# Patient Record
Sex: Female | Born: 1990 | Race: White | Hispanic: No | Marital: Single | Smoking: Never smoker
Health system: Southern US, Community
[De-identification: ages and names within clinical notes are randomized; demographics above are authoritative.]

## PROBLEM LIST (undated history)

## (undated) DIAGNOSIS — A6920 Lyme disease, unspecified: Secondary | ICD-10-CM

---

## 2012-01-09 ENCOUNTER — Ambulatory Visit (HOSPITAL_COMMUNITY)
Admission: EM | Admit: 2012-01-09 | Discharge: 2012-01-10 | Disposition: A | Discharge status: Home / Self Care | Payer: Managed Care, Other (non HMO) | Attending: General Surgery | Admitting: General Surgery

## 2012-01-09 ENCOUNTER — Other Ambulatory Visit (INDEPENDENT_AMBULATORY_CARE_PROVIDER_SITE_OTHER): Payer: Self-pay | Admitting: General Surgery

## 2012-01-09 ENCOUNTER — Encounter (HOSPITAL_COMMUNITY)
Admission: EM | Disposition: A | Discharge status: Home / Self Care | Payer: Self-pay | Source: Home / Self Care | Attending: Emergency Medicine

## 2012-01-09 ENCOUNTER — Encounter (HOSPITAL_COMMUNITY): Payer: Self-pay | Admitting: *Deleted

## 2012-01-09 ENCOUNTER — Encounter (HOSPITAL_COMMUNITY): Payer: Self-pay | Admitting: Registered Nurse

## 2012-01-09 ENCOUNTER — Emergency Department (HOSPITAL_COMMUNITY): Payer: Managed Care, Other (non HMO)

## 2012-01-09 ENCOUNTER — Emergency Department (HOSPITAL_COMMUNITY): Payer: Managed Care, Other (non HMO) | Admitting: Registered Nurse

## 2012-01-09 DIAGNOSIS — K602 Anal fissure, unspecified: Secondary | ICD-10-CM

## 2012-01-09 DIAGNOSIS — K37 Unspecified appendicitis: Secondary | ICD-10-CM

## 2012-01-09 DIAGNOSIS — K625 Hemorrhage of anus and rectum: Secondary | ICD-10-CM | POA: Insufficient documentation

## 2012-01-09 DIAGNOSIS — K921 Melena: Secondary | ICD-10-CM

## 2012-01-09 DIAGNOSIS — R109 Unspecified abdominal pain: Secondary | ICD-10-CM | POA: Insufficient documentation

## 2012-01-09 DIAGNOSIS — R110 Nausea: Secondary | ICD-10-CM | POA: Insufficient documentation

## 2012-01-09 DIAGNOSIS — K3580 Unspecified acute appendicitis: Secondary | ICD-10-CM | POA: Insufficient documentation

## 2012-01-09 DIAGNOSIS — R1031 Right lower quadrant pain: Secondary | ICD-10-CM

## 2012-01-09 DIAGNOSIS — R197 Diarrhea, unspecified: Secondary | ICD-10-CM | POA: Insufficient documentation

## 2012-01-09 DIAGNOSIS — Z87898 Personal history of other specified conditions: Secondary | ICD-10-CM | POA: Insufficient documentation

## 2012-01-09 HISTORY — DX: Lyme disease, unspecified: A69.20

## 2012-01-09 HISTORY — PX: EXAMINATION UNDER ANESTHESIA: SHX1540

## 2012-01-09 HISTORY — PX: LAPAROSCOPIC APPENDECTOMY: SHX408

## 2012-01-09 LAB — CBC
HCT: 38.1 % (ref 36.0–46.0)
Hemoglobin: 13.3 g/dL (ref 12.0–15.0)
MCHC: 34.9 g/dL (ref 30.0–36.0)
RDW: 12.2 % (ref 11.5–15.5)
WBC: 9.5 10*3/uL (ref 4.0–10.5)

## 2012-01-09 LAB — COMPREHENSIVE METABOLIC PANEL
ALT: 12 U/L (ref 0–35)
AST: 15 U/L (ref 0–37)
Albumin: 4.3 g/dL (ref 3.5–5.2)
Alkaline Phosphatase: 46 U/L (ref 39–117)
CO2: 24 mEq/L (ref 19–32)
Chloride: 103 mEq/L (ref 96–112)
Creatinine, Ser: 0.77 mg/dL (ref 0.50–1.10)
GFR calc non Af Amer: >90 mL/min (ref >90)
Potassium: 3.7 mEq/L (ref 3.5–5.1)
Total Bilirubin: 0.6 mg/dL (ref 0.3–1.2)

## 2012-01-09 LAB — URINALYSIS, ROUTINE W REFLEX MICROSCOPIC
Bilirubin Urine: NEGATIVE
Glucose, UA: NEGATIVE mg/dL
Hgb urine dipstick: NEGATIVE
Ketones, ur: NEGATIVE mg/dL
Protein, ur: NEGATIVE mg/dL
Urobilinogen, UA: 0.2 mg/dL (ref 0.0–1.0)

## 2012-01-09 LAB — OCCULT BLOOD, POC DEVICE: Fecal Occult Bld: NEGATIVE

## 2012-01-09 LAB — POCT PREGNANCY, URINE: Preg Test, Ur: NEGATIVE

## 2012-01-09 LAB — DIFFERENTIAL
Basophils Absolute: 0 10*3/uL (ref 0.0–0.1)
Basophils Relative: 0 % (ref 0–1)
Lymphocytes Relative: 11 % — ABNORMAL LOW (ref 12–46)
Monocytes Absolute: 0.5 10*3/uL (ref 0.1–1.0)
Neutro Abs: 8 10*3/uL — ABNORMAL HIGH (ref 1.7–7.7)
Neutrophils Relative %: 83 % — ABNORMAL HIGH (ref 43–77)

## 2012-01-09 SURGERY — APPENDECTOMY, LAPAROSCOPIC
Anesthesia: General | Site: Rectum | Wound class: Contaminated

## 2012-01-09 MED ORDER — PROPOFOL 10 MG/ML IV EMUL
INTRAVENOUS | Status: DC | PRN
  Administered 2012-01-09: 140 mg via INTRAVENOUS

## 2012-01-09 MED ORDER — KETOROLAC TROMETHAMINE 30 MG/ML IJ SOLN: 15.0000 mg | Freq: Once | INTRAMUSCULAR | Status: DC | PRN

## 2012-01-09 MED ORDER — ONDANSETRON HCL 4 MG/2ML IJ SOLN
4.0000 mg | Freq: Once | INTRAMUSCULAR | Status: AC
  Administered 2012-01-09: 4 mg via INTRAVENOUS
  Filled 2012-01-09: qty 2

## 2012-01-09 MED ORDER — KETOROLAC TROMETHAMINE 15 MG/ML IJ SOLN
INTRAMUSCULAR | Status: AC
  Administered 2012-01-09: 15 mg
  Filled 2012-01-09: qty 1

## 2012-01-09 MED ORDER — KCL IN DEXTROSE-NACL 20-5-0.9 MEQ/L-%-% IV SOLN
INTRAVENOUS | Status: DC
  Administered 2012-01-09: 19:00:00 via INTRAVENOUS
  Filled 2012-01-09 (×5): qty 1000

## 2012-01-09 MED ORDER — MIDAZOLAM HCL 5 MG/5ML IJ SOLN
INTRAMUSCULAR | Status: DC | PRN
  Administered 2012-01-09: 2 mg via INTRAVENOUS

## 2012-01-09 MED ORDER — SODIUM CHLORIDE 0.9 % IV SOLN: 3.0000 g | Freq: Once | INTRAVENOUS | Status: DC

## 2012-01-09 MED ORDER — LACTATED RINGERS IV SOLN
INTRAVENOUS | Status: DC | PRN
  Administered 2012-01-09: 15:00:00 via INTRAVENOUS

## 2012-01-09 MED ORDER — GLYCOPYRROLATE 0.2 MG/ML IJ SOLN
INTRAMUSCULAR | Status: DC | PRN
  Administered 2012-01-09: .3 mg via INTRAVENOUS

## 2012-01-09 MED ORDER — ONDANSETRON HCL 4 MG/2ML IJ SOLN: 4.0000 mg | Freq: Four times a day (QID) | INTRAMUSCULAR | Status: DC | PRN

## 2012-01-09 MED ORDER — ONDANSETRON HCL 4 MG/2ML IJ SOLN
INTRAMUSCULAR | Status: DC | PRN
  Administered 2012-01-09: 4 mg via INTRAVENOUS

## 2012-01-09 MED ORDER — NEOSTIGMINE METHYLSULFATE 1 MG/ML IJ SOLN
INTRAMUSCULAR | Status: DC | PRN
  Administered 2012-01-09: 2.5 mg via INTRAVENOUS

## 2012-01-09 MED ORDER — LIDOCAINE HCL (CARDIAC) 20 MG/ML IV SOLN
INTRAVENOUS | Status: DC | PRN
  Administered 2012-01-09: 80 mg via INTRAVENOUS

## 2012-01-09 MED ORDER — BUPIVACAINE-EPINEPHRINE 0.5% -1:200000 IJ SOLN
INTRAMUSCULAR | Status: DC | PRN
  Administered 2012-01-09: 11 mL

## 2012-01-09 MED ORDER — BUPIVACAINE-EPINEPHRINE (PF) 0.5% -1:200000 IJ SOLN
INTRAMUSCULAR | Status: AC
  Filled 2012-01-09: qty 10

## 2012-01-09 MED ORDER — SODIUM CHLORIDE 0.9 % IV BOLUS (SEPSIS): 1000.0000 mL | Freq: Once | INTRAVENOUS | Status: DC

## 2012-01-09 MED ORDER — DROPERIDOL 2.5 MG/ML IJ SOLN
INTRAMUSCULAR | Status: DC | PRN
  Administered 2012-01-09: 0.625 mg via INTRAVENOUS

## 2012-01-09 MED ORDER — SODIUM CHLORIDE 0.9 % IV SOLN
3.0000 g | Freq: Four times a day (QID) | INTRAVENOUS | Status: DC
  Administered 2012-01-09: 3 g via INTRAVENOUS

## 2012-01-09 MED ORDER — ROCURONIUM BROMIDE 100 MG/10ML IV SOLN
INTRAVENOUS | Status: DC | PRN
  Administered 2012-01-09: 25 mg via INTRAVENOUS

## 2012-01-09 MED ORDER — AMPICILLIN-SULBACTAM SODIUM 3 (2-1) G IJ SOLR
3.0000 g | Freq: Four times a day (QID) | INTRAMUSCULAR | Status: AC
  Administered 2012-01-09 – 2012-01-10 (×2): 3 g via INTRAVENOUS
  Filled 2012-01-09 (×3): qty 3

## 2012-01-09 MED ORDER — SODIUM CHLORIDE 0.9 % IV SOLN: Freq: Once | INTRAVENOUS | Status: DC

## 2012-01-09 MED ORDER — SUCCINYLCHOLINE CHLORIDE 20 MG/ML IJ SOLN
INTRAMUSCULAR | Status: DC | PRN
  Administered 2012-01-09: 100 mg via INTRAVENOUS

## 2012-01-09 MED ORDER — DEXAMETHASONE SODIUM PHOSPHATE 10 MG/ML IJ SOLN
INTRAMUSCULAR | Status: DC | PRN
  Administered 2012-01-09: 10 mg via INTRAVENOUS

## 2012-01-09 MED ORDER — ONDANSETRON HCL 4 MG PO TABS: 4.0000 mg | ORAL_TABLET | Freq: Four times a day (QID) | ORAL | Status: DC | PRN

## 2012-01-09 MED ORDER — FENTANYL CITRATE 0.05 MG/ML IJ SOLN: 25.0000 ug | INTRAMUSCULAR | Status: DC | PRN

## 2012-01-09 MED ORDER — FENTANYL CITRATE 0.05 MG/ML IJ SOLN
INTRAMUSCULAR | Status: DC | PRN
  Administered 2012-01-09 (×5): 50 ug via INTRAVENOUS

## 2012-01-09 MED ORDER — HYDROCODONE-ACETAMINOPHEN 5-325 MG PO TABS
1.0000 | ORAL_TABLET | ORAL | Status: DC | PRN
  Administered 2012-01-09 – 2012-01-10 (×3): 1 via ORAL
  Administered 2012-01-10: 2 via ORAL
  Administered 2012-01-10: 1 via ORAL
  Filled 2012-01-09: qty 2
  Filled 2012-01-09 (×3): qty 1
  Filled 2012-01-09: qty 2

## 2012-01-09 MED ORDER — PROMETHAZINE HCL 25 MG/ML IJ SOLN: 6.2500 mg | INTRAMUSCULAR | Status: DC | PRN

## 2012-01-09 MED ORDER — LACTATED RINGERS IV SOLN: INTRAVENOUS | Status: DC

## 2012-01-09 MED ORDER — MORPHINE SULFATE 2 MG/ML IJ SOLN: 2.0000 mg | INTRAMUSCULAR | Status: DC | PRN

## 2012-01-09 MED ORDER — IOHEXOL 300 MG/ML  SOLN
100.0000 mL | Freq: Once | INTRAMUSCULAR | Status: AC | PRN
  Administered 2012-01-09: 100 mL via INTRAVENOUS

## 2012-01-09 MED ORDER — 0.9 % SODIUM CHLORIDE (POUR BTL) OPTIME
TOPICAL | Status: DC | PRN
  Administered 2012-01-09: 1000 mL

## 2012-01-09 MED ORDER — LACTATED RINGERS IR SOLN
Status: DC | PRN
  Administered 2012-01-09: 1000 mL

## 2012-01-09 SURGICAL SUPPLY — 59 items
APPLIER CLIP 5 13 M/L LIGAMAX5 (MISCELLANEOUS)
APPLIER CLIP ROT 10 11.4 M/L (STAPLE) ×3
BENZOIN TINCTURE PRP APPL 2/3 (GAUZE/BANDAGES/DRESSINGS) ×3 IMPLANT
BLADE HEX COATED 2.75 (ELECTRODE) IMPLANT
BLADE SURG 15 STRL LF DISP TIS (BLADE) IMPLANT
BLADE SURG 15 STRL SS (BLADE)
CANISTER SUCTION 2500CC (MISCELLANEOUS) ×3 IMPLANT
CHLORAPREP W/TINT 26ML (MISCELLANEOUS) ×3 IMPLANT
CLIP APPLIE 5 13 M/L LIGAMAX5 (MISCELLANEOUS) IMPLANT
CLIP APPLIE ROT 10 11.4 M/L (STAPLE) ×2 IMPLANT
CLOTH BEACON ORANGE TIMEOUT ST (SAFETY) ×3 IMPLANT
CUTTER FLEX LINEAR 45M (STAPLE) ×3 IMPLANT
DECANTER SPIKE VIAL GLASS SM (MISCELLANEOUS) ×3 IMPLANT
DRAIN PENROSE 18X1/2 LTX STRL (DRAIN) IMPLANT
DRAPE LAPAROSCOPIC ABDOMINAL (DRAPES) ×3 IMPLANT
DRAPE TABLE BACK 44X90 PK DISP (DRAPES) IMPLANT
DRAPE UTILITY XL STRL (DRAPES) ×3 IMPLANT
DRSG PAD ABDOMINAL 8X10 ST (GAUZE/BANDAGES/DRESSINGS) IMPLANT
DRSG TEGADERM 2-3/8X2-3/4 SM (GAUZE/BANDAGES/DRESSINGS) ×9 IMPLANT
ELECT REM PT RETURN 9FT ADLT (ELECTROSURGICAL) ×3
ELECTRODE REM PT RTRN 9FT ADLT (ELECTROSURGICAL) ×2 IMPLANT
ENDOLOOP SUT PDS II  0 18 (SUTURE)
ENDOLOOP SUT PDS II 0 18 (SUTURE) IMPLANT
GAUZE SPONGE 4X4 16PLY XRAY LF (GAUZE/BANDAGES/DRESSINGS) IMPLANT
GLOVE BIOGEL PI IND STRL 7.0 (GLOVE) ×2 IMPLANT
GLOVE BIOGEL PI INDICATOR 7.0 (GLOVE) ×1
GLOVE ECLIPSE 8.0 STRL XLNG CF (GLOVE) ×3 IMPLANT
GLOVE INDICATOR 8.0 STRL GRN (GLOVE) ×6 IMPLANT
GOWN STRL NON-REIN LRG LVL3 (GOWN DISPOSABLE) ×3 IMPLANT
GOWN STRL REIN XL XLG (GOWN DISPOSABLE) ×6 IMPLANT
KIT BASIN OR (CUSTOM PROCEDURE TRAY) ×3 IMPLANT
LUBRICANT JELLY K Y 4OZ (MISCELLANEOUS) IMPLANT
NDL SAFETY ECLIPSE 18X1.5 (NEEDLE) IMPLANT
NEEDLE HYPO 18GX1.5 SHARP (NEEDLE)
NEEDLE HYPO 25X1 1.5 SAFETY (NEEDLE) IMPLANT
NS IRRIG 1000ML POUR BTL (IV SOLUTION) IMPLANT
PACK LITHOTOMY IV (CUSTOM PROCEDURE TRAY) IMPLANT
PENCIL BUTTON HOLSTER BLD 10FT (ELECTRODE) ×3 IMPLANT
POUCH SPECIMEN RETRIEVAL 10MM (ENDOMECHANICALS) ×3 IMPLANT
RELOAD 45 VASCULAR/THIN (ENDOMECHANICALS) IMPLANT
RELOAD STAPLE TA45 3.5 REG BLU (ENDOMECHANICALS) ×3 IMPLANT
SCALPEL HARMONIC ACE (MISCELLANEOUS) ×3 IMPLANT
SET IRRIG TUBING LAPAROSCOPIC (IRRIGATION / IRRIGATOR) ×3 IMPLANT
SOLUTION ANTI FOG 6CC (MISCELLANEOUS) ×3 IMPLANT
SPONGE GAUZE 4X4 12PLY (GAUZE/BANDAGES/DRESSINGS) IMPLANT
SPONGE SURGIFOAM ABS GEL 12-7 (HEMOSTASIS) IMPLANT
STRIP CLOSURE SKIN 1/2X4 (GAUZE/BANDAGES/DRESSINGS) ×3 IMPLANT
SUT CHROMIC 2 0 SH (SUTURE) IMPLANT
SUT CHROMIC 3 0 SH 27 (SUTURE) IMPLANT
SUT MNCRL AB 4-0 PS2 18 (SUTURE) ×3 IMPLANT
SYR CONTROL 10ML LL (SYRINGE) IMPLANT
TOWEL OR 17X26 10 PK STRL BLUE (TOWEL DISPOSABLE) ×3 IMPLANT
TRAY FOLEY CATH 14FRSI W/METER (CATHETERS) ×3 IMPLANT
TRAY LAP CHOLE (CUSTOM PROCEDURE TRAY) ×3 IMPLANT
TROCAR BLADELESS OPT 5 75 (ENDOMECHANICALS) ×6 IMPLANT
TROCAR XCEL BLUNT TIP 100MML (ENDOMECHANICALS) ×3 IMPLANT
TUBING INSUFFLATION 10FT LAP (TUBING) ×3 IMPLANT
UNDERPAD 30X30 INCONTINENT (UNDERPADS AND DIAPERS) ×3 IMPLANT
YANKAUER SUCT BULB TIP 10FT TU (MISCELLANEOUS) IMPLANT

## 2012-01-09 NOTE — ED Provider Notes (Signed)
History     CSN: [id number]  Arrival date & time 01/09/12  0845   First MD Initiated Contact with Patient 01/09/12 [phone]      Chief Complaint  Patient presents with  . Abdominal Pain  . Nausea  . Diarrhea    (Consider location/radiation/quality/duration/timing/severity/associated sxs/prior treatment) Patient is a 21 y.o. female presenting with abdominal pain and diarrhea. The history is provided by the patient and a relative.  Abdominal Pain The primary symptoms of the illness include abdominal pain and diarrhea. The current episode started 13 to 24 hours ago. The onset of the illness was gradual.  The pain came on suddenly. The abdominal pain has been gradually worsening since its onset. The abdominal pain is located in the RLQ and suprapubic region. The abdominal pain does not radiate. The severity of the abdominal pain is 1/10. The abdominal pain is relieved by nothing. The abdominal pain is exacerbated by vomiting.  The diarrhea began more than 1 week ago. The diarrhea is bloody. The diarrhea occurs 2 to 4 times per day.  The patient states that she believes she is currently not pregnant. The patient has not had a change in bowel habit. Symptoms associated with the illness do not include chills or constipation.  Diarrhea The primary symptoms include abdominal pain and diarrhea.  The illness does not include chills or constipation.    Past Medical History  Diagnosis Date  . Lyme disease     History reviewed. No pertinent past surgical history.  History reviewed. No pertinent family history.  History  Substance Use Topics  . Smoking status: Never Smoker   . Smokeless tobacco: Never Used  . Alcohol Use: Yes     socially    OB History    Grav Para Term Preterm Abortions TAB SAB Ect Mult Living                  Review of Systems  Constitutional: Negative for chills.  Gastrointestinal: Positive for abdominal pain and diarrhea. Negative for constipation.  All other  systems reviewed and are negative.    Allergies  Latex  Home Medications  No current outpatient prescriptions on file.  BP 107/63  Pulse 90  Temp(Src) 98.2 F (36.8 C) (Oral)  Resp 18  Wt 115 lb (52.164 kg)  SpO2 100%  LMP 12/29/2011  Physical Exam  Nursing note and vitals reviewed. Constitutional: She is oriented to person, place, and time. She appears well-developed and well-nourished.  Non-toxic appearance. No distress.  HENT:  Head: Normocephalic and atraumatic.  Eyes: Conjunctivae, EOM and lids are normal. Pupils are equal, round, and reactive to light.  Neck: Normal range of motion. Neck supple. No tracheal deviation present. No mass present.  Cardiovascular: Normal rate, regular rhythm and normal heart sounds.  Exam reveals no gallop.   No murmur heard. Pulmonary/Chest: Effort normal and breath sounds normal. No stridor. No respiratory distress. She has no decreased breath sounds. She has no wheezes. She has no rhonchi. She has no rales.  Abdominal: Soft. Normal appearance and bowel sounds are normal. She exhibits no distension. There is generalized tenderness. There is no rigidity, no rebound, no guarding and no CVA tenderness.  Genitourinary: Rectum normal.  Musculoskeletal: Normal range of motion. She exhibits no edema and no tenderness.  Neurological: She is alert and oriented to person, place, and time. She has normal strength. No cranial nerve deficit or sensory deficit. GCS eye subscore is 4. GCS verbal subscore is 5. GCS motor subscore  is 6.  Skin: Skin is warm and dry. No abrasion and no rash noted.  Psychiatric: She has a normal mood and affect. Her speech is normal and behavior is normal.    ED Course  Procedures (including critical care time)   Labs Reviewed  CBC  DIFFERENTIAL  COMPREHENSIVE METABOLIC PANEL  URINALYSIS, ROUTINE W REFLEX MICROSCOPIC  URINE CULTURE   No results found.   No diagnosis found.    MDM  Spoke with patient on the  results of her studies. Also spoke with the general surgeon on call and come see patient        Toy Baker, MD 01/09/12 1251

## 2012-01-09 NOTE — Transfer of Care (Signed)
Immediate Anesthesia Transfer of Care Note  Patient: Barbara Pacheco  Procedure(s) Performed: Procedure(s) (LRB): APPENDECTOMY LAPAROSCOPIC (N/A) EXAM UNDER ANESTHESIA (N/A)  Patient Location: PACU  Anesthesia Type: General  Level of Consciousness: awake, alert , oriented and patient cooperative  Airway & Oxygen Therapy: Patient Spontanous Breathing and Patient connected to face mask oxygen  Post-op Assessment: Report given to PACU RN, Post -op Vital signs reviewed and stable and Patient moving all extremities  Post vital signs: Reviewed and stable  Complications: No apparent anesthesia complications

## 2012-01-09 NOTE — ED Notes (Signed)
Pt from home with reports of nausea, lower abdominal pain and diarrhea that started last night. Pt also reporting bright red blood in stool for a few weeks. Pt denies recent surgery or injury to abdominal area.

## 2012-01-09 NOTE — ED Notes (Signed)
Notified Amy in CT that pt has drank contrast and ready to transport

## 2012-01-09 NOTE — Anesthesia Postprocedure Evaluation (Signed)
  Anesthesia Post-op Note  Patient: Barbara Pacheco  Procedure(s) Performed: Procedure(s) (LRB): APPENDECTOMY LAPAROSCOPIC (N/A) EXAM UNDER ANESTHESIA (N/A)  Patient Location: PACU  Anesthesia Type: General  Level of Consciousness: awake and alert   Airway and Oxygen Therapy: Patient Spontanous Breathing  Post-op Pain: mild  Post-op Assessment: Post-op Vital signs reviewed, Patient's Cardiovascular Status Stable, Respiratory Function Stable, Patent Airway and No signs of Nausea or vomiting  Post-op Vital Signs: stable  Complications: No apparent anesthesia complications

## 2012-01-09 NOTE — H&P (Addendum)
Barbara Pacheco is an 21 y.o. female.   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  Initially the pain was mild and she was able to go back to sleep. However, the pain progressively worsened and she presented to the emergency department for evaluation. She had a little nausea while drinking contrast for a CT scan. She has anorexia. No fever or chills. No dysuria or hematuria. Of note is that she's had some bright red blood per rectum after bowel movement for the last 2 or 3 weeks. She denies any straining or pain during bowel movements.  The CT scan demonstrates findings suspicious for acute appendicitis with dilation the appendix and some mild inflammatory changes present. It also suggests the possibility of a short segment intussusception of the small bowel in the left lower abdominal area.  Past Medical History  Diagnosis Date  . Lyme disease     Orthostatic hypotension  History reviewed. No pertinent past surgical history.  History reviewed. No pertinent family history. Social History:  reports that she has never smoked. She has never used smokeless tobacco. She reports that she drinks alcohol. She reports that she does not use illicit drugs.  Allergies:  Allergies  Allergen Reactions  . Latex Rash    "a little irritation"    Medications Prior to Admission  Medication Dose Route Frequency Provider Last Rate Last Dose  . 0.9 %  sodium chloride infusion   Intravenous Once Toy Baker, MD      . Ampicillin-Sulbactam (UNASYN) 3 g in sodium chloride 0.9 % 100 mL IVPB  3 g Intravenous Q6H Adolph Pollack, MD      . iohexol (OMNIPAQUE) 300 MG/ML solution 100 mL  100 mL Intravenous Once PRN Medication Radiologist, MD   100 mL at 01/09/12 1149  . ondansetron (ZOFRAN) injection 4 mg  4 mg Intravenous Once Toy Baker, MD   4 mg at 01/09/12 0958  . sodium chloride 0.9 % bolus 1,000 mL  1,000  mL Intravenous Once Toy Baker, MD       No current outpatient prescriptions on file as of 01/09/2012.    Results for orders placed during the hospital encounter of 01/09/12 (from the past 48 hour(s))  CBC     Status: Normal   Collection Time   01/09/12  9:45 AM      Component Value Range Comment   WBC 9.5  4.0 - 10.5 (K/uL)    RBC 4.46  3.87 - 5.11 (MIL/uL)    Hemoglobin 13.3  12.0 - 15.0 (g/dL)    HCT 16.1  09.6 - 04.5 (%)    MCV 85.4  78.0 - 100.0 (fL)    MCH 29.8  26.0 - 34.0 (pg)    MCHC 34.9  30.0 - 36.0 (g/dL)    RDW 40.9  81.1 - 91.4 (%)    Platelets 152  150 - 400 (K/uL)   DIFFERENTIAL     Status: Abnormal   Collection Time   01/09/12  9:45 AM      Component Value Range Comment   Neutrophils Relative 83 (*) 43 - 77 (%)    Neutro Abs 8.0 (*) 1.7 - 7.7 (K/uL)    Lymphocytes Relative 11 (*) 12 - 46 (%)    Lymphs Abs 1.0  0.7 - 4.0 (K/uL)    Monocytes Relative 6  3 - 12 (%)    Monocytes  Absolute 0.5  0.1 - 1.0 (K/uL)    Eosinophils Relative 1  0 - 5 (%)    Eosinophils Absolute 0.1  0.0 - 0.7 (K/uL)    Basophils Relative 0  0 - 1 (%)    Basophils Absolute 0.0  0.0 - 0.1 (K/uL)   COMPREHENSIVE METABOLIC PANEL     Status: Normal   Collection Time   01/09/12  9:45 AM      Component Value Range Comment   Sodium 137  135 - 145 (mEq/L)    Potassium 3.7  3.5 - 5.1 (mEq/L)    Chloride 103  96 - 112 (mEq/L)    CO2 24  19 - 32 (mEq/L)    Glucose, Bld 91  70 - 99 (mg/dL)    BUN 14  6 - 23 (mg/dL)    Creatinine, Ser 4.54  0.50 - 1.10 (mg/dL)    Calcium 9.4  8.4 - 10.5 (mg/dL)    Total Protein 7.3  6.0 - 8.3 (g/dL)    Albumin 4.3  3.5 - 5.2 (g/dL)    AST 15  0 - 37 (U/L)    ALT 12  0 - 35 (U/L)    Alkaline Phosphatase 46  39 - 117 (U/L)    Total Bilirubin 0.6  0.3 - 1.2 (mg/dL)    GFR calc non Af Amer >90  >90 (mL/min)    GFR calc Af Amer >90  >90 (mL/min)   OCCULT BLOOD, POC DEVICE     Status: Normal   Collection Time   01/09/12  9:58 AM      Component Value Range Comment     Fecal Occult Bld NEGATIVE     URINALYSIS, ROUTINE W REFLEX MICROSCOPIC     Status: Normal   Collection Time   01/09/12  9:59 AM      Component Value Range Comment   Color, Urine YELLOW  YELLOW     APPearance CLEAR  CLEAR     Specific Gravity, Urine 1.018  1.005 - 1.030     pH 6.0  5.0 - 8.0     Glucose, UA NEGATIVE  NEGATIVE (mg/dL)    Hgb urine dipstick NEGATIVE  NEGATIVE     Bilirubin Urine NEGATIVE  NEGATIVE     Ketones, ur NEGATIVE  NEGATIVE (mg/dL)    Protein, ur NEGATIVE  NEGATIVE (mg/dL)    Urobilinogen, UA 0.2  0.0 - 1.0 (mg/dL)    Nitrite NEGATIVE  NEGATIVE     Leukocytes, UA NEGATIVE  NEGATIVE  MICROSCOPIC NOT DONE ON URINES WITH NEGATIVE PROTEIN, BLOOD, LEUKOCYTES, NITRITE, OR GLUCOSE <1000 mg/dL.  POCT PREGNANCY, URINE     Status: Normal   Collection Time   01/09/12 10:22 AM      Component Value Range Comment   Preg Test, Ur NEGATIVE  NEGATIVE     Ct Abdomen Pelvis W Contrast  01/09/2012  *RADIOLOGY REPORT*  Clinical Data: Abdominal pain. Bloody diarrhea.  Right lower quadrant and suprapubic pain.  Vomiting.  CT ABDOMEN AND PELVIS WITH CONTRAST  Technique:  Multidetector CT imaging of the abdomen and pelvis was performed following the standard protocol during bolus administration of intravenous contrast.  Contrast: OMNIPAQUE IOHEXOL 300 MG/ML IJ SOLN  Comparison: None.  Findings: Images of the lung bases are unremarkable.  No focal abnormality identified within the liver, spleen, pancreas, adrenal glands, or kidneys.  The gallbladder is present.  Within the left mid abdomen, there is a short segment intussusception, best seen on coronal  image number 29 and axial image number 41.  The appendix is thick-walled and contains fluid. Contrast is identified within the colon but no contrast fills the appendix.  Appendix measures 9 mm in diameter and there is a small amount of periappendiceal stranding.  The lung bases are clear.  No focal abnormality identified within the liver,  spleen, pancreas, adrenal glands, or kidneys.  The gallbladder is present.  There are scattered mesenteric lymph nodes, predominately within the right lower quadrant.  No evidence for aortic aneurysm.  The uterus is present.  No adnexal mass identified.  There is a small amount of free pelvic fluid in the right cul-de-sac.  Visualized osseous structures have a normal appearance.  IMPRESSION:  1.  Left mid abdominal short segment small bowel intussusception. A lead point should be considered. 2.  The appendix appears thickened and inflamed.  Findings are suspicious for acute appendicitis.  The findings were discussed with Dr. Freida Busman on 01/09/2012 at 12:10 p.m.  Original Report Authenticated By: Patterson Hammersmith, M.D.    Review of Systems  Constitutional: Negative for fever and chills.  HENT: Negative for congestion and sore throat.   Respiratory: Negative for cough.   Cardiovascular: Negative for chest pain.  Gastrointestinal: Positive for nausea, abdominal pain and blood in stool. Negative for diarrhea and constipation.  Genitourinary: Negative for dysuria and hematuria.  Neurological: Negative for seizures.  Endo/Heme/Allergies:       No DVTs.    Blood pressure 102/57, pulse 87, temperature 98.1 F (36.7 C), temperature source Oral, resp. rate 20, weight 115 lb (52.164 kg), last menstrual period 12/29/2011, SpO2 100.00%. Physical Exam  Constitutional: She appears well-developed and well-nourished. No distress.  HENT:  Head: Normocephalic and atraumatic.  Eyes: EOM are normal. No scleral icterus.       Wearing glasses.  Neck: Neck supple.  Cardiovascular: Normal rate and regular rhythm.   Respiratory: Effort normal and breath sounds normal.  GI: Soft. She exhibits no distension and no mass. There is tenderness (rlq area). There is guarding (voluntary).  Musculoskeletal: Normal range of motion. She exhibits no edema.  Lymphadenopathy:    She has no cervical adenopathy.  Neurological:  She is alert.  Skin: Skin is warm and dry.     Assessment/Plan 1. Acute appendicitis-does not appear perforated.  2. Rectal bleeding with bowel movements-could be secondary to hemorrhoids or a small fissure.  Plan: 1. Laparoscopic possible open appendectomy. 2. Rectal exam under anesthesia.  The procedures, risks, and after care were discussed with her. Risks include but are not limited to bleeding, infection, wound healing problems, anesthesia, injury to intra-abdominal organs. She seems to understand all this and agrees with the plan. We'll give her 3 g of IV Unasyn.  Keilah Lemire J 01/09/2012, 2:37 PM

## 2012-01-09 NOTE — Preoperative (Signed)
Beta Blockers   Reason not to administer Beta Blockers:Not Applicable 

## 2012-01-09 NOTE — Anesthesia Preprocedure Evaluation (Addendum)
Anesthesia Evaluation  Patient identified by MRN, date of birth, ID band Patient awake    Reviewed: Allergy & Precautions, H&P , NPO status , Patient's Chart, lab work & pertinent test results  Airway Mallampati: II TM Distance: >3 FB Neck ROM: Full    Dental No notable dental hx.    Pulmonary neg pulmonary ROS,  breath sounds clear to auscultation  Pulmonary exam normal       Cardiovascular negative cardio ROS  Rhythm:Regular Rate:Normal     Neuro/Psych negative neurological ROS  negative psych ROS   GI/Hepatic negative GI ROS, Neg liver ROS,   Endo/Other  negative endocrine ROS  Renal/GU negative Renal ROS  negative genitourinary   Musculoskeletal negative musculoskeletal ROS (+)   Abdominal   Peds negative pediatric ROS (+)  Hematology negative hematology ROS (+)   Anesthesia Other Findings   Reproductive/Obstetrics negative OB ROS                           Anesthesia Physical Anesthesia Plan  ASA: I and Emergent  Anesthesia Plan: General   Post-op Pain Management:    Induction: Intravenous and Rapid sequence  Airway Management Planned:   Additional Equipment:   Intra-op Plan:   Post-operative Plan:   Informed Consent: I have reviewed the patients History and Physical, chart, labs and discussed the procedure including the risks, benefits and alternatives for the proposed anesthesia with the patient or authorized representative who has indicated his/her understanding and acceptance.   Dental advisory given  Plan Discussed with: CRNA  Anesthesia Plan Comments:         Anesthesia Quick Evaluation

## 2012-01-09 NOTE — Op Note (Signed)
Operative Note  Barbara Pacheco female 21 y.o. 01/09/2012  PREOPERATIVE DX:  1. Acute appendicitis. 2. Rectal bleeding  POSTOPERATIVE DX:  1. Acute appendicitis. 2. Posterior anal fissure  PROCEDURE:  1. Laparoscopic appendectomy. 2. Rectal examination under anesthesia         Surgeon: Adolph Pollack   Assistants: None  Anesthesia: General endotracheal anesthesia  Indications: This is a 21 year old female who awoke this morning with lower abdominal pain that progressively worsened and was associated with some nausea and anorexia. She also states for 2 or 3 weeks she's had some rectal bleeding after bowel movements it is bright red in nature with no pain. CT scan has demonstrated findings consistent with acute appendicitis In a question of small bowel intussusception in the left mid to lower abdominal area.. She is brought to the operating room for the above procedures.    Procedure Detail:  She was brought to the operating room placed supine on the operating table and a general anesthetic was administered. A Foley catheter was inserted. The abdominal wall was widely sterilely prepped and draped. Marcaine was infiltrated in the subumbilical region. A small subumbilical incision was made through the skin, subcutaneous tissue, fascia, and peritoneum entering the peritoneal cavity under direct vision.  A Hassan trocar was introduced into the peritoneal cavity and a pneumoperitoneum was created by insufflation of CO2 gas. Visualization of the area under the trocar demonstrated no evidence of organ injury or bleeding.  A 5 mm trocar is placed in the left lower quadrant. The cecum was grasped and an edematous inflamed appendix was identified in the right lower quadrant. There is no evidence of abscess or perforation. A right upper quadrant 5 mm trocar was then inserted. The appendix was grasped and retracted anteriorly. The mesial appendix was divided down to the base the appendix with the  harmonic scalpel. The appendix was amputated off the cecum, with a small cuff of cecum, using the Endo GIA stapler. The appendix was placed in a retrieval bag and removed through the subumbilical incision. The subumbilical trocar was replaced.  Copious irrigation was performed of the right lower quadrant area. There is a small amount of bleeding from the staple line on the cecum this was controlled with a Hemoclip. The staple line was otherwise solid.    I then inspected the left abdomen were a question of intussusception was noted on CT scan. The small intestine was examined and no evidence of this was found.  Irrigation fluid was evacuated as much as possible. No further bleeding from the staple line was noted. The subumbilical trocar was removed and the fascial defect closed with a 0 Vicryl pursestring suture. The remaining trochars were removed and the pneumoperitoneum was released.  Skin incisions were closed with 4-0 Monocryl subcuticular stitches followed by Steri-Strips and sterile dressings.  She was then placed in the lithotomy position. Rectal examination under anesthesia was performed and demonstrated a posterior anal fissure with no evidence of bleeding. No anal masses were noted.  She tolerated both procedures without any apparent complications and was taken to the recovery room in satisfactory condition.   Estimated Blood Loss:  Minimal         Drains: none         Blood Given: none          Specimens: Appendix        Complications:  * No complications entered in OR log *         Disposition: PACU -  hemodynamically stable.         Condition: stable

## 2012-01-10 LAB — URINE CULTURE
Colony Count: >=100000
Culture  Setup Time: 201303031440

## 2012-01-10 MED ORDER — POLYETHYLENE GLYCOL 3350 17 G PO PACK
17.0000 g | PACK | Freq: Every day | ORAL | Status: DC
  Administered 2012-01-10: 17 g via ORAL
  Filled 2012-01-10: qty 1

## 2012-01-10 MED ORDER — DILTIAZEM GEL 2 %: 1.0000 "application " | Freq: Four times a day (QID) | CUTANEOUS | Status: DC

## 2012-01-10 MED ORDER — ACETAMINOPHEN 325 MG PO TABS: 650.0000 mg | ORAL_TABLET | Freq: Four times a day (QID) | ORAL | Status: DC | PRN

## 2012-01-10 MED ORDER — POLYETHYLENE GLYCOL 3350 17 G PO PACK: 17.0000 g | PACK | Freq: Every day | ORAL | Status: DC

## 2012-01-10 MED ORDER — POLYETHYLENE GLYCOL 3350 17 G PO PACK
17.0000 g | PACK | Freq: Two times a day (BID) | ORAL | Status: DC
  Filled 2012-01-10 (×3): qty 1

## 2012-01-10 MED ORDER — HYDROCODONE-ACETAMINOPHEN 5-325 MG PO TABS: 1.0000 | ORAL_TABLET | ORAL | Status: AC | PRN

## 2012-01-10 MED ORDER — POLYETHYLENE GLYCOL 3350 17 G PO PACK: 17.0000 g | PACK | Freq: Two times a day (BID) | ORAL | Status: AC

## 2012-01-10 MED ORDER — DILTIAZEM GEL 2 %: Freq: Four times a day (QID) | CUTANEOUS | Status: DC

## 2012-01-10 NOTE — Progress Notes (Signed)
Nutrition Brief Note  - Pt screened for unintentional weight loss of 20 pounds in the past year, however pt states this was not nutrition related but resulted from having Lyme disease. Pt reports she is currently eating well after appendectomy yesterday. Pt denies any nausea or diarrhea, only c/o some bloating after eating. Noted possible plans for d/c home later today. No nutrition diagnosis at this time.   Dietitian # [phone]

## 2012-01-10 NOTE — Discharge Summary (Signed)
Physician Discharge Summary  Patient ID: Barbara Pacheco MRN: [id number] DOB/AGE: 1990/12/22 20 y.o.  Admit date: 01/09/2012 Discharge date: 01/10/2012  Admission Diagnoses: Acute appendicitis-does not appear perforated. Rectal bleeding with bowel movements-could be secondary to hemorrhoids or a small fissure.   Lyme disease    Orthostatic hypotension         Discharge Diagnoses: 1. Acute appendicitis. 2. Posterior anal fissure    PROCEDURES:  Laparoscopic appendectomy. 2. Rectal examination under anesthesia 01/09/12  Dr. 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 Initially the pain was mild and she was able to go back to sleep. However, the pain progressively worsened and she presented to the emergency department for evaluation. She had a little nausea while drinking contrast for a CT scan. She has anorexia. No fever or chills. No dysuria or hematuria. Of note is that she's had some bright red blood per rectum after bowel movement for the last 2 or 3 weeks. She denies any straining or pain during bowel movements. The CT scan demonstrates findings suspicious for acute appendicitis with dilation the appendix and some mild inflammatory changes present. It also suggests the possibility of a short segment intussusception of the small bowel in the left lower abdominal area. Pt taken to OR with above noted procedure.  No intussusception noted on exam in OR. PT is doing well POD 1. Diet is being advanced. She was ready to go late PM 01/10/12.   Disposition: Home  Medication List  As of 01/10/2012  5:09 PM   TAKE these medications         acetaminophen 325 MG tablet   Commonly known as: TYLENOL   Take 2 tablets (650 mg total) by mouth every 6 (six) hours as needed.      diltiazem 2 % Gel   Apply 1 application topically 4 (four) times daily.      doxycycline 100 MG tablet   Commonly known as: VIBRA-TABS   Take 100  mg by mouth 2 (two) times daily. For 21 days      HYDROcodone-acetaminophen 5-325 MG per tablet   Commonly known as: NORCO   Take 1-2 tablets by mouth every 4 (four) hours as needed.      LOESTRIN 1/20 (21) PO   Take 1 tablet by mouth daily.      polyethylene glycol packet   Commonly known as: MIRALAX / GLYCOLAX   Take 17 g by mouth 2 (two) times daily.           Follow-up Information    Follow up with ROSENBOWER,TODD J, MD. Schedule an appointment as soon as possible for a visit in 2 weeks.   Contact information:   [employer] [address] [place] [address] [phone]       Follow up with No lifting over 20 pounds for 4 weeks.  Return to work in 3 weeks..         SignedSherrie George 01/10/2012, 5:09 PM

## 2012-01-10 NOTE — Discharge Instructions (Signed)
Laparoscopic Appendectomy Appendectomy is surgery to remove the appendix. Laparoscopic surgery uses several small cuts (incisions) instead of one large incision. Laparoscopic surgery offers a shorter recovery time and less discomfort. LET YOUR CAREGIVER KNOW ABOUT:  Allergies to food or medicine.   Medicines taken, including vitamins, dietary supplements, herbs, eyedrops, over-the-counter medicines, and creams.   Use of steroids (by mouth or creams).   Previous problems with anesthetics or numbing medicines.   History of bleeding problems or blood clots.   Previous surgery.   Other health problems, including diabetes, heart problems, lung problems, and kidney problems.   Possibility of pregnancy, if this applies.  RISKS AND COMPLICATIONS  Infection. A germ starts growing in the wound. This can usually be treated with antibiotics. In some cases, the wound will need to be opened and cleaned.   Bleeding.   Damage to other organs.   Sores (abscesses).   Chronic pain at the incision sites. This is defined as pain that lasts for more than 3 months.   Blood clots in the legs that may rarely travel to the lungs.   Infection in the lungs (pneumonia).  BEFORE THE PROCEDURE Appendectomy is usually performed immediately after an inflamed appendix (appendicitis) is diagnosed. No preparation is necessary ahead of this procedure. PROCEDURE  You will be given medicine that makes you sleep (general anesthetic). After you are asleep, a flexible tube (catheter) may be inserted into your bladder to drain your urine during surgery. The tube is removed before you wake up after surgery. When you are asleep, carbondioxide gas will be used to inflate your abdomen. This will allow your surgeon to see inside your abdomen and perform your surgery. Three small incisions will be made in your abdomen. Your surgeon will insert a thin, lighted tube (laparoscope) through one of the incisions. Your surgeon will  look through the laparoscope while performing the surgery. Other tools will be inserted through the other incisions. Laparoscopic procedures may not be appropriate when:  There is major scarring from a previous surgery.   The patient has bleeding disorders.   A pregnancy is near term.   There are other conditions which make the laparoscopic procedure impossible, such as an advanced infection or a ruptured appendix.  If your surgeon feels it is not safe to continue with the laparoscopic procedure, he or she will perform an open surgery instead. This gives the surgeon a larger view and more space to work. Open surgery requires a longer recovery time. After your appendix is removed, your incisions will be closed with stitches (sutures) or skin adhesive. AFTER THE PROCEDURE You will be taken to a recovery room. When the anesthesia has worn off, you will be returned to your hospital room. You will be given pain medicines to keep you comfortable. Ask your caregiver how long your hospital stay will be. Document Released: 06/08/2004 Document Revised: 10/14/2011 Document Reviewed: 05/04/2011 [employer] Patient Information 2012 [employer]CCS ______[facility] LAPAROSCOPIC SURGERY: POST OP INSTRUCTIONS Always review your discharge instruction sheet given to you by the facility where your surgery was performed. IF YOU HAVE DISABILITY OR FAMILY LEAVE FORMS, YOU MUST BRING THEM TO THE OFFICE FOR PROCESSING.   DO NOT GIVE THEM TO YOUR DOCTOR.  1. A prescription for pain medication may be given to you upon discharge.  Take your pain medication as prescribed, if needed.  If narcotic pain medicine is not needed, then you may take acetaminophen (Tylenol) or ibuprofen (Advil) as needed. 2. Take your   usually prescribed medications unless otherwise directed. 3. If you need a refill on your pain medication, please contact your pharmacy.  They will contact our office to request authorization.  Prescriptions will not be filled after 5pm or on week-ends. 4. You should follow a light diet the first few days after arrival home, such as soup and crackers, etc.  Be sure to include lots of fluids daily. 5. Most patients will experience some swelling and bruising in the area of the incisions.  Ice packs will help.  Swelling and bruising can take several days to resolve.  6. It is common to experience some constipation if taking pain medication after surgery.  Increasing fluid intake and taking a stool softener (such as Colace) will usually help or prevent this problem from occurring.  A mild laxative (Milk of Magnesia or Miralax) should be taken according to package instructions if there are no bowel movements after 48 hours. 7. Unless discharge instructions indicate otherwise, you may remove your bandages 24-48 hours after surgery, and you may shower at that time.  You may have steri-strips (small skin tapes) in place directly over the incision.  These strips should be left on the skin for 7-10 days.  If your surgeon used skin glue on the incision, you may shower in 24 hours.  The glue will flake off over the next 2-3 weeks.  Any sutures or staples will be removed at the office during your follow-up visit. 8. ACTIVITIES:  You may resume regular (light) daily activities beginning the next day--such as daily self-care, walking, climbing stairs--gradually increasing activities as tolerated.  You may have sexual intercourse when it is comfortable.  Refrain from any heavy lifting or straining until approved by your doctor. a. You may drive when you are no longer taking prescription pain medication, you can comfortably wear a seatbelt, and you can safely maneuver your car and apply brakes. b. RETURN TO WORK:  __________________________________________________________ 9. You should see your doctor in the office for a follow-up appointment approximately 2-3 weeks after your surgery.  Make sure that you call for  this appointment within a day or two after you arrive home to insure a convenient appointment time. 10. OTHER INSTRUCTIONS: __________________________________________________________________________________________________________________________ __________________________________________________________________________________________________________________________ WHEN TO CALL YOUR DOCTOR: 1. Fever over 101.0 2. Inability to urinate 3. Continued bleeding from incision. 4. Increased pain, redness, or drainage from the incision. 5. Increasing abdominal pain  The clinic staff is available to answer your questions during regular business hours.  Please don't hesitate to call and ask to speak to one of the nurses for clinical concerns.  If you have a medical emergency, go to the nearest emergency room or call 911.  A surgeon from [facility] is always on call at the hospital. [address], [address], [place], [place]  [address] ? [address] [address], [place], [place]   [address] [phone] ? [phone] ? FAX [phone] Web site: [email or web address] Anal Fissure, Adult An anal fissure is a small tear or crack in the skin around the anus. Bleeding from a fissure usually stops on its own within a few minutes. However, bleeding will often reoccur with each bowel movement until the crack heals.  CAUSES   Passing large, hard stools.   Frequent diarrheal stools.   Constipation.   Inflammatory bowel disease (Crohn's disease or ulcerative colitis).   Infections.   Anal sex.  SYMPTOMS   Small amounts of blood seen on your stools, on toilet paper, or in the toilet  after a bowel movement.   Rectal bleeding.   Painful bowel movements.   Itching or irritation around the anus.  DIAGNOSIS Your caregiver will examine the anal area. An anal fissure can usually be seen with careful inspection. A rectal exam may be performed and a short tube (anoscope) may be used to  examine the anal canal. TREATMENT   You may be instructed to take fiber supplements. These supplements can soften your stool to help make bowel movements easier.   Sitz baths may be recommended to help heal the tear. Do not use soap in the sitz baths.   A medicated cream or ointment may be prescribed to lessen discomfort.  HOME CARE INSTRUCTIONS   Maintain a diet high in fruits, whole grains, and vegetables. Avoid constipating foods like bananas and dairy products.   Take sitz baths as directed by your caregiver.   Drink enough fluids to keep your urine clear or pale yellow.   Only take over-the-counter or prescription medicines for pain, discomfort, or fever as directed by your caregiver. Do not take aspirin as this may increase bleeding.   Do not use ointments containing numbing medications (anesthetics) or hydrocortisone. They could slow healing.  SEEK MEDICAL CARE IF:   Your fissure is not completely healed within 3 days.   You have further bleeding.   You have a fever.   You have diarrhea mixed with blood.   You have pain.   Your problem is getting worse rather than better.  MAKE SURE YOU:   Understand these instructions.   Will watch your condition.   Will get help right away if you are not doing well or get worse.  Document Released: 10/25/2005 Document Revised: 10/14/2011 Document Reviewed: 04/11/2011 [facility] Patient Information 2012 [place], [place].

## 2012-01-10 NOTE — Progress Notes (Signed)
1 Day Post-Op  Subjective: Afebrile, VSS: BP 96/59 lying down, No labs,  Objective: Vital signs in last 24 hours: Temp:  [97.8 F (36.6 C)-98.4 F (36.9 C)] 97.8 F (36.6 C) (03/04 0603) Pulse Rate:  [66-87] 66  (03/04 0603) Resp:  [14-20] 14  (03/04 0603) BP: (96-115)/(56-73) 96/59 mmHg (03/04 0603) SpO2:  [97 %-100 %] 98 % (03/04 0603) Weight:  [52.164 kg (115 lb)] 52.164 kg (115 lb) (03/03 1848)    Intake/Output from previous day: 03/03 0701 - 03/04 0700 In: 3176.7 [P.O.:360; I.V.:2716.7; IV Piggyback:100] Out: 2860 [Urine:2850; Blood:10] Intake/Output this shift: Total I/O In: 240 [P.O.:240] Out: 300 [Urine:300]  PE:  Alert, just took two Vicodin  So not much pain.  Abd:  Soft, tender, few BS. Took a regular breakfast.    Lab Results:   Basename 01/09/12 0945  WBC 9.5  HGB 13.3  HCT 38.1  PLT 152    BMET  Basename 01/09/12 0945  NA 137  K 3.7  CL 103  CO2 24  GLUCOSE 91  BUN 14  CREATININE 0.77  CALCIUM 9.4   PT/INR No results found for this basename: LABPROT:2,INR:2 in the last 72 hours   Studies/Results: Ct Abdomen Pelvis W Contrast  01/09/2012  *RADIOLOGY REPORT*  Clinical Data: Abdominal pain. Bloody diarrhea.  Right lower quadrant and suprapubic pain.  Vomiting.  CT ABDOMEN AND PELVIS WITH CONTRAST  Technique:  Multidetector CT imaging of the abdomen and pelvis was performed following the standard protocol during bolus administration of intravenous contrast.  Contrast: OMNIPAQUE IOHEXOL 300 MG/ML IJ SOLN  Comparison: None.  Findings: Images of the lung bases are unremarkable.  No focal abnormality identified within the liver, spleen, pancreas, adrenal glands, or kidneys.  The gallbladder is present.  Within the left mid abdomen, there is a short segment intussusception, best seen on coronal image number 29 and axial image number 41.  The appendix is thick-walled and contains fluid. Contrast is identified within the colon but no contrast fills the  appendix.  Appendix measures 9 mm in diameter and there is a small amount of periappendiceal stranding.  The lung bases are clear.  No focal abnormality identified within the liver, spleen, pancreas, adrenal glands, or kidneys.  The gallbladder is present.  There are scattered mesenteric lymph nodes, predominately within the right lower quadrant.  No evidence for aortic aneurysm.  The uterus is present.  No adnexal mass identified.  There is a small amount of free pelvic fluid in the right cul-de-sac.  Visualized osseous structures have a normal appearance.  IMPRESSION:  1.  Left mid abdominal short segment small bowel intussusception. A lead point should be considered. 2.  The appendix appears thickened and inflamed.  Findings are suspicious for acute appendicitis.  The findings were discussed with Dr. Freida Busman on 01/09/2012 at 12:10 p.m.  Original Report Authenticated By: Patterson Hammersmith, M.D.    Anti-infectives: Anti-infectives     Start     Dose/Rate Route Frequency Ordered Stop   01/09/12 2200   Ampicillin-Sulbactam (UNASYN) 3 g in sodium chloride 0.9 % 100 mL IVPB  Status:  Discontinued        3 g 100 mL/hr over 60 Minutes Intravenous Every 6 hours 01/09/12 1436 01/09/12 1721   01/09/12 2200  Ampicillin-Sulbactam (UNASYN) 3 g in sodium chloride 0.9 % 100 mL IVPB       3 g 100 mL/hr over 60 Minutes Intravenous Every 6 hours 01/09/12 1721 01/10/12 0521   01/09/12  1445   Ampicillin-Sulbactam (UNASYN) 3 g in sodium chloride 0.9 % 100 mL IVPB  Status:  Discontinued        3 g 100 mL/hr over 60 Minutes Intravenous  Once 01/09/12 1438 01/09/12 1752         Current Facility-Administered Medications  Medication Dose Route Frequency Provider Last Rate Last Dose  . Ampicillin-Sulbactam (UNASYN) 3 g in sodium chloride 0.9 % 100 mL IVPB  3 g Intravenous Q6H Adolph Pollack, MD   3 g at 01/10/12 0421  . dextrose 5 % and 0.9 % NaCl with KCl 20 mEq/L infusion   Intravenous Continuous Adolph Pollack, MD 100 mL/hr at 01/09/12 1900    . HYDROcodone-acetaminophen (NORCO) 5-325 MG per tablet 1-2 tablet  1-2 tablet Oral Q4H PRN Adolph Pollack, MD   2 tablet at 01/10/12 0910  . iohexol (OMNIPAQUE) 300 MG/ML solution 100 mL  100 mL Intravenous Once PRN Medication Radiologist, MD   100 mL at 01/09/12 1149  . ketorolac (TORADOL) 15 MG/ML injection        15 mg at 01/09/12 1714  . morphine 2 MG/ML injection 2-4 mg  2-4 mg Intravenous Q2H PRN Adolph Pollack, MD      . ondansetron [facility]) tablet 4 mg  4 mg Oral Q6H PRN Adolph Pollack, MD       Or  . ondansetron [facility]) injection 4 mg  4 mg Intravenous Q6H PRN Adolph Pollack, MD      . DISCONTD: 0.9 %  sodium chloride infusion   Intravenous Once Toy Baker, MD      . DISCONTD: 0.9 % irrigation (POUR BTL)    PRN Adolph Pollack, MD   1,000 mL at 01/09/12 1613  . DISCONTD: Ampicillin-Sulbactam (UNASYN) 3 g in sodium chloride 0.9 % 100 mL IVPB  3 g Intravenous Q6H Adolph Pollack, MD   3 g at 01/09/12 1551  . DISCONTD: Ampicillin-Sulbactam (UNASYN) 3 g in sodium chloride 0.9 % 100 mL IVPB  3 g Intravenous Once Adolph Pollack, MD      . DISCONTD: bupivacaine-EPINEPHrine (MARCAINE W/ EPI) 0.5 % (with pres) injection    PRN Adolph Pollack, MD   11 mL at 01/09/12 1633  . DISCONTD: fentaNYL (SUBLIMAZE) injection 25-50 mcg  25-50 mcg Intravenous Q5 min PRN Riesa Pope, MD      . DISCONTD: ketorolac (TORADOL) 30 MG/ML injection 15-30 mg  15-30 mg Intravenous Once PRN Riesa Pope, MD      . DISCONTD: lactated ringers infusion   Intravenous Continuous Elisabeth Cara, CRNA      . DISCONTD: lactated ringers irrigation solution    PRN Adolph Pollack, MD   1,000 mL at 01/09/12 1612  . DISCONTD: promethazine (PHENERGAN) injection 6.25-12.5 mg  6.25-12.5 mg Intravenous Q15 min PRN Riesa Pope, MD      . DISCONTD: sodium chloride 0.9 % bolus 1,000 mL  1,000 mL Intravenous Once Toy Baker, MD        Facility-Administered Medications Ordered in Other Encounters  Medication Dose Route Frequency Provider Last Rate Last Dose  . DISCONTD: dexamethasone (DECADRON) injection    PRN Elisabeth Cara, CRNA   10 mg at 01/09/12 1553  . DISCONTD: droperidol (INAPSINE) injection    PRN Elisabeth Cara, CRNA   0.625 mg at 01/09/12 1552  . DISCONTD: fentaNYL (SUBLIMAZE) injection    PRN Elisabeth Cara, CRNA   50  mcg at 01/09/12 1704  . DISCONTD: glycopyrrolate (ROBINUL) injection    PRN Elisabeth Cara, CRNA   0.3 mg at 01/09/12 1652  . DISCONTD: lactated ringers infusion    Continuous PRN Elisabeth Cara, CRNA      . DISCONTD: lidocaine (cardiac) 100 mg/88ml (XYLOCAINE) 20 MG/ML injection 2%    PRN Elisabeth Cara, CRNA   80 mg at 01/09/12 1548  . DISCONTD: midazolam (VERSED) 5 MG/5ML injection    PRN Elisabeth Cara, CRNA   2 mg at 01/09/12 1541  . DISCONTD: neostigmine (PROSTIGMINE) injection   Intravenous PRN Elisabeth Cara, CRNA   2.5 mg at 01/09/12 1652  . DISCONTD: ondansetron (ZOFRAN) injection    PRN Elisabeth Cara, CRNA   4 mg at 01/09/12 1650  . DISCONTD: propofol (DIPRIVAN) 10 MG/ML infusion    PRN Elisabeth Cara, CRNA   140 mg at 01/09/12 1548  . DISCONTD: rocuronium (ZEMURON) injection    PRN Elisabeth Cara, CRNA   25 mg at 01/09/12 1554  . DISCONTD: succinylcholine (ANECTINE) injection    PRN Elisabeth Cara, CRNA   100 mg at 01/09/12 1548    Assessment/Plan .1. Acute appendicitis. 2. Posterior anal fissure  s/p  Laparoscopic appendectomy. 2. Rectal examination under anesthesia, no sign of Intussusception noted at time of surgery. Dr.Rosenbower 01/09/12.  Orthostatic hypotension   Get her up and walk her some see how she does.  She would like to go home later if she can.     LOS: 1 day    Lelia Jons 01/10/2012

## 2012-01-10 NOTE — Progress Notes (Signed)
Pt is alert and oriented, vital signs are stable for discharge, discharge instructions reviewed with patient and patient's parent, prescricptions given and questions and concerns answered, iv removed and patient tolerated well, patient to follow up with MD Means, Pryor Guettler N 01-10-12 18:35pm

## 2012-01-11 NOTE — Progress Notes (Signed)
Making progress Prob D/C home later today if meets goals

## 2012-01-12 ENCOUNTER — Encounter (HOSPITAL_COMMUNITY): Payer: Self-pay | Admitting: General Surgery

## 2012-02-02 ENCOUNTER — Encounter (INDEPENDENT_AMBULATORY_CARE_PROVIDER_SITE_OTHER): Payer: Self-pay | Admitting: General Surgery

## 2012-02-02 ENCOUNTER — Ambulatory Visit (INDEPENDENT_AMBULATORY_CARE_PROVIDER_SITE_OTHER): Payer: Managed Care, Other (non HMO) | Admitting: General Surgery

## 2012-02-02 VITALS — BP 112/76 | HR 72 | Temp 98.6°F | Resp 12 | Ht 64.0 in | Wt 115.0 lb

## 2012-02-02 DIAGNOSIS — K602 Anal fissure, unspecified: Secondary | ICD-10-CM | POA: Insufficient documentation

## 2012-02-02 DIAGNOSIS — K3580 Unspecified acute appendicitis: Secondary | ICD-10-CM

## 2012-02-02 NOTE — Patient Instructions (Signed)
Diet and activities as tolerated. 

## 2012-02-02 NOTE — Progress Notes (Signed)
She is here for a postop visit after lap appendectomy for acute appendicitis and EUA where she was discovered to have a posterior anal fissure.  She stated she had had a significant episode of constipation weeks prior to the surgery.  Diet is being tolerated.  Bowels are moving with minimal pain.  No fever or chills.  No wound problems.   PE: ABD: soft, incisions clean/dry/intact and solid  ANORECTAL:  Multiple small perianal and buttock raised skin lesions. Posterior anal fissure present with no bleeding.  Assessment:  Doing well post appendectomy.  Minimally symptomatic from posterior anal fissure.  Also, has numerous skin lesions of unclear etiology in the perianal area. Plan:  Diet and activities as tolerated.  Keep stools soft.  I discussed biopsy of one of the skin lesions in the office and have asked her to call back if she would like to have this done here.  I told her the cause of the lesions should be investigated.

## 2013-04-17 ENCOUNTER — Other Ambulatory Visit: Payer: Self-pay | Admitting: Family Medicine

## 2013-04-17 DIAGNOSIS — R1084 Generalized abdominal pain: Secondary | ICD-10-CM

## 2013-04-18 ENCOUNTER — Ambulatory Visit
Admission: RE | Admit: 2013-04-18 | Discharge: 2013-04-18 | Disposition: A | Discharge status: Home / Self Care | Payer: Managed Care, Other (non HMO) | Source: Ambulatory Visit | Attending: Family Medicine | Admitting: Family Medicine

## 2013-04-18 DIAGNOSIS — R1084 Generalized abdominal pain: Secondary | ICD-10-CM

## 2013-11-03 IMAGING — CT CT ABD-PELV W/ CM
1 of 2 series · 15 of 32 positions shown, 19 images · IV contrast (APPLIED)
Comparison: None.

CLINICAL DATA: Abdominal pain. Bloody diarrhea.  Right lower
quadrant and suprapubic pain.  Vomiting.

CT ABDOMEN AND PELVIS WITH CONTRAST
TECHNIQUE: Multidetector CT imaging of the abdomen and pelvis was
performed following the standard protocol during bolus
administration of intravenous contrast.
Contrast: 100mL OMNIPAQUE IOHEXOL 300 MG/ML IJ SOLN

[Series 2: abd/pel with · axial · 0.60mm/px · z∈[+1034,+1414]mm · 15 of 84 slices shown, 19 images]
[im 4/84  soft-tissue]
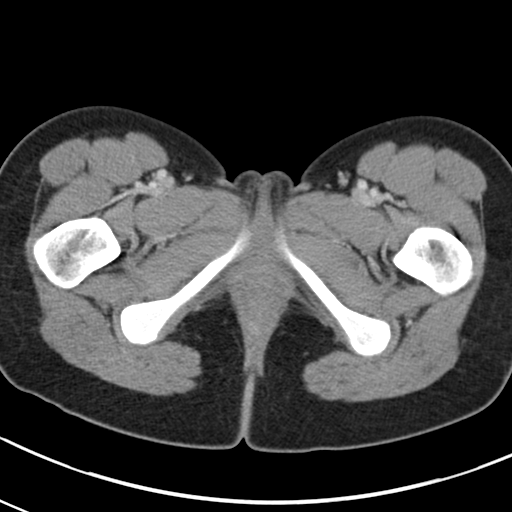
[im 4/84  bone]
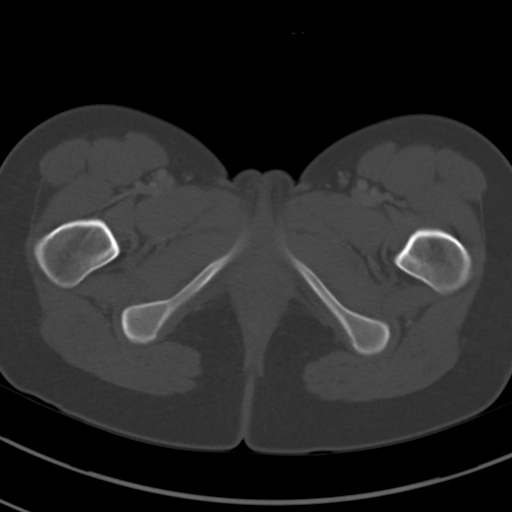
[im 10/84  soft-tissue]
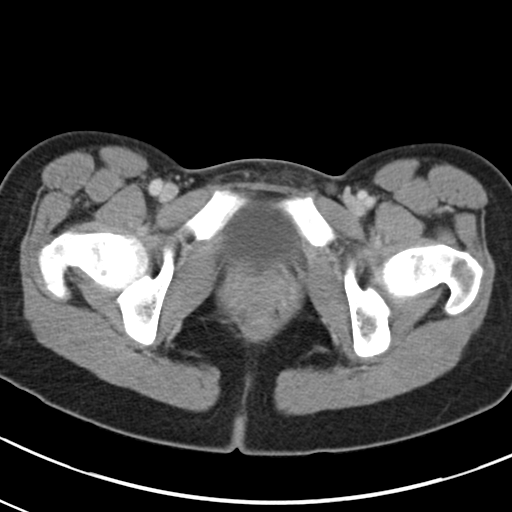
[im 16/84  soft-tissue]
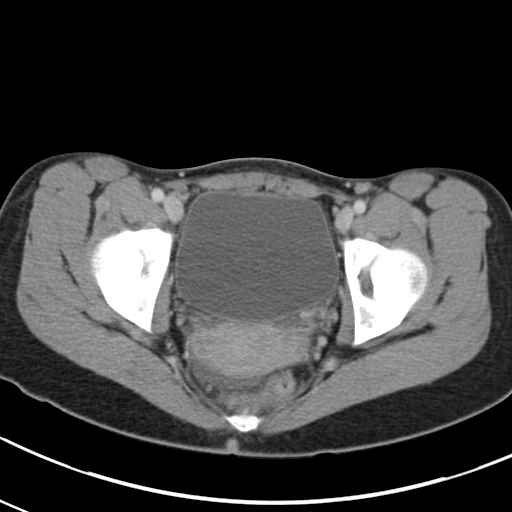
[im 23/84  soft-tissue]
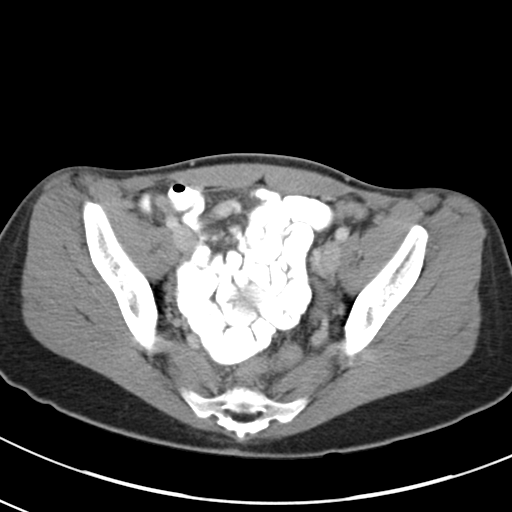
[im 29/84  soft-tissue]
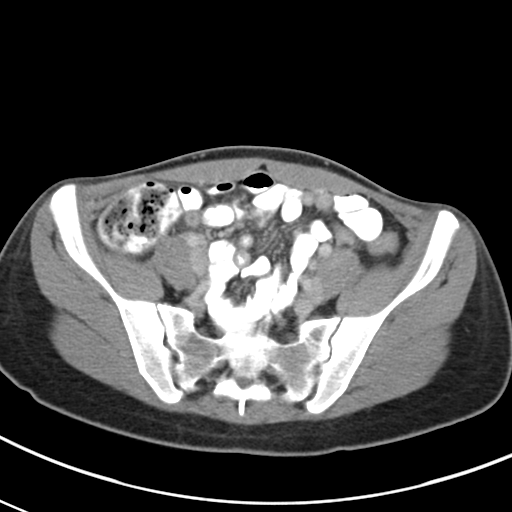
[im 36/84  soft-tissue]
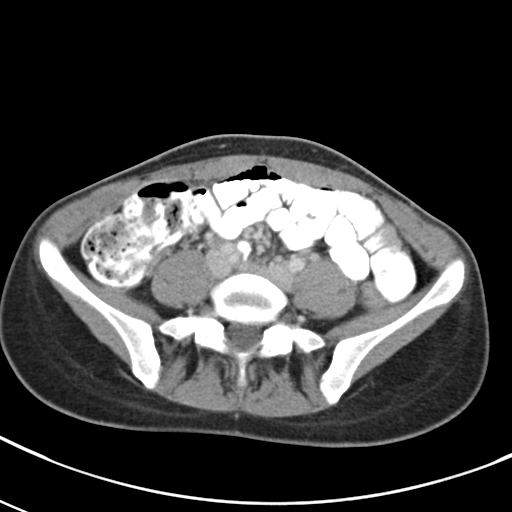
[im 42/84  soft-tissue]
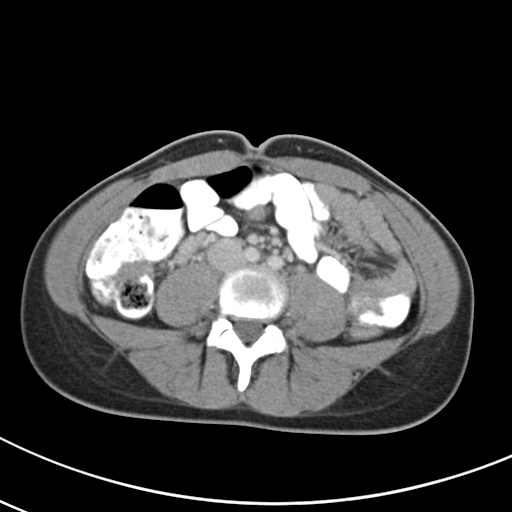
[im 48/84  soft-tissue]
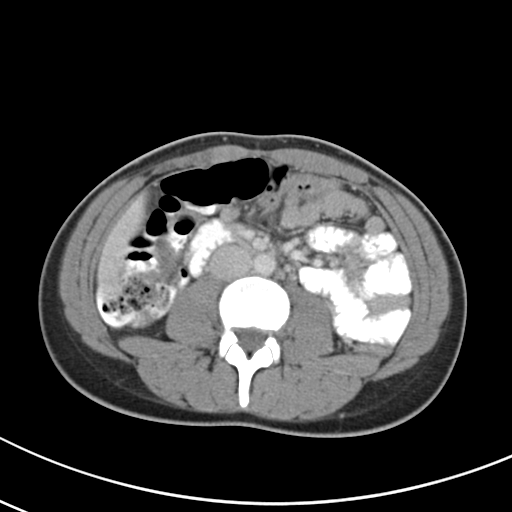
[im 55/84  soft-tissue]
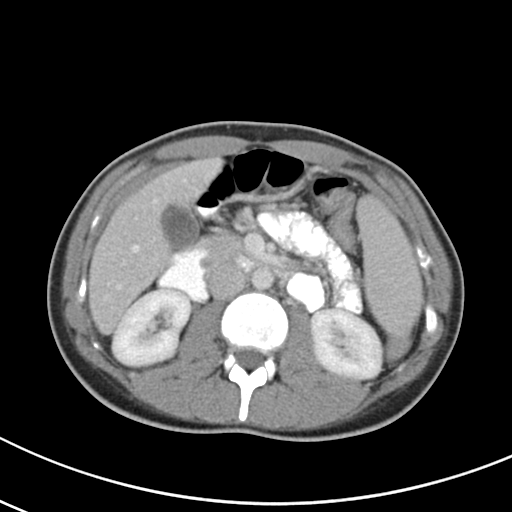
[im 55/84  bone]
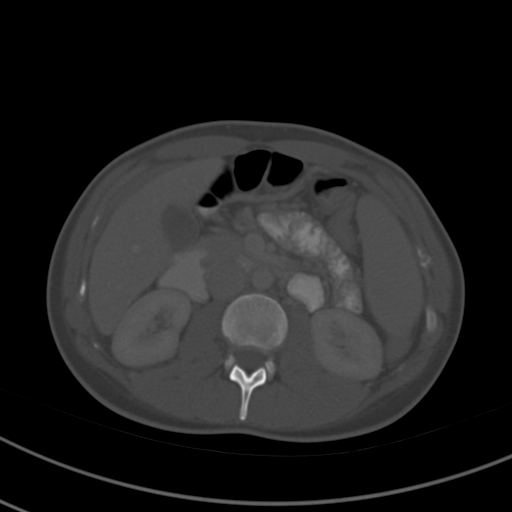
[im 61/84  soft-tissue]
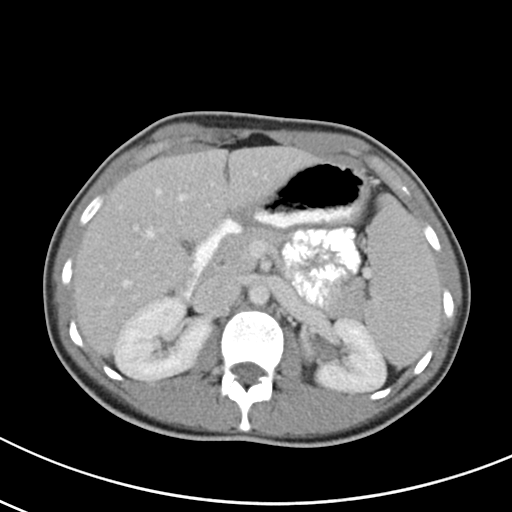
[im 68/84  soft-tissue]
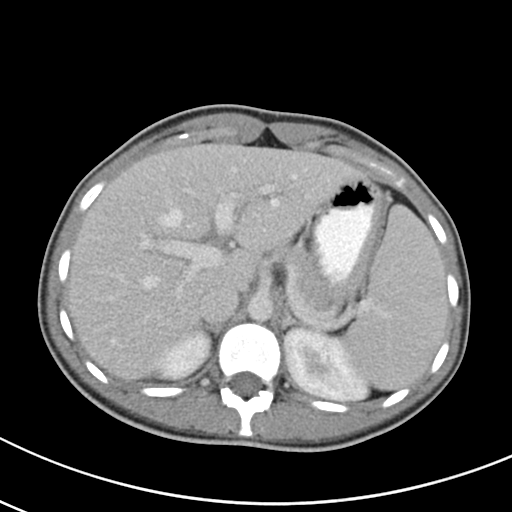
[im 71/84  lung]
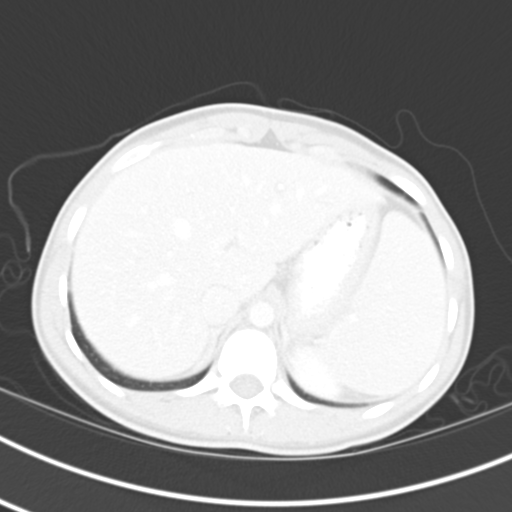
[im 74/84  soft-tissue]
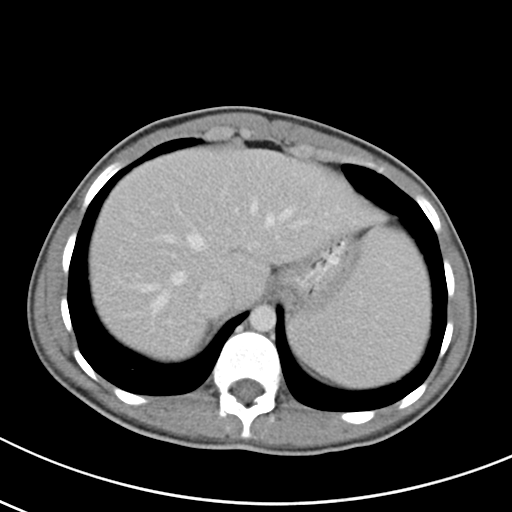
[im 74/84  lung]
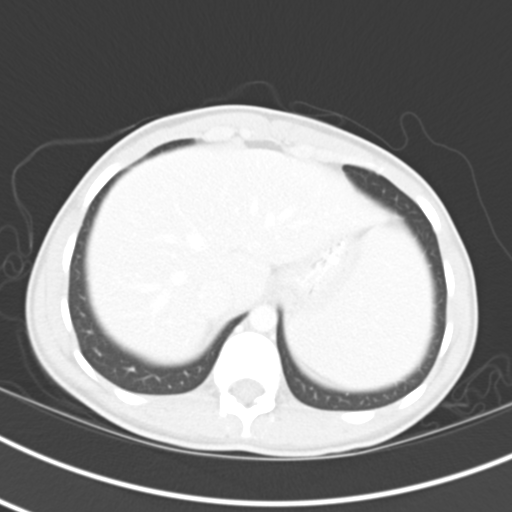
[im 77/84  lung]
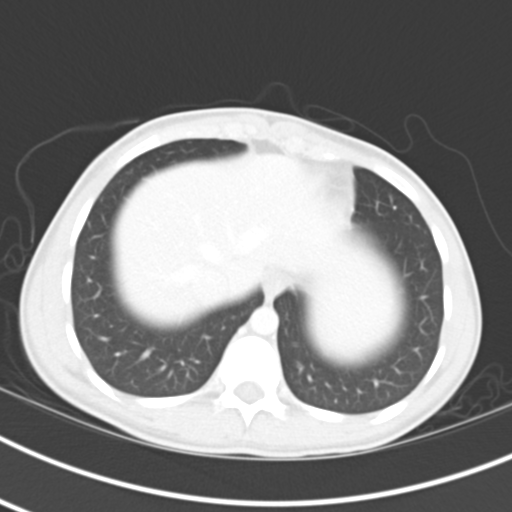
[im 80/84  soft-tissue]
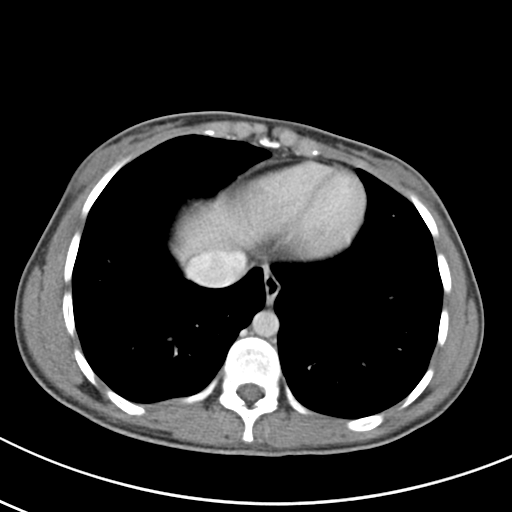
[im 80/84  lung]
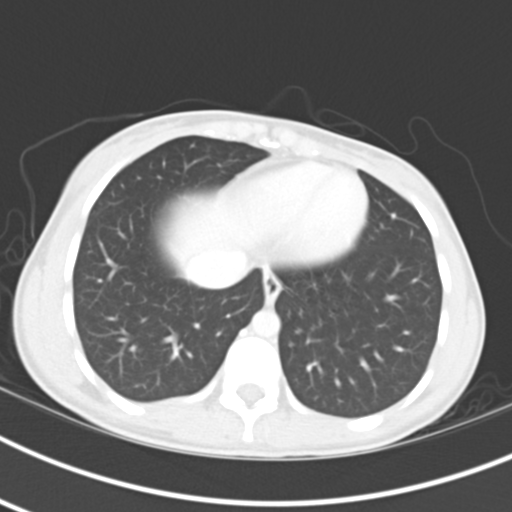

[15 of 32 positions shown; findings below may reference images not displayed]

FINDINGS: Images of the lung bases are unremarkable.  No focal
abnormality identified within the liver, spleen, pancreas, adrenal
glands, or kidneys.  The gallbladder is present.

Within the left mid abdomen, there is a short segment
intussusception, best seen on coronal image number 29 and axial
image number 41.  The appendix is thick-walled and contains fluid.
Contrast is identified within the colon but no contrast fills the
appendix.  Appendix measures 9 mm in diameter and there is a small
amount of periappendiceal stranding.

The lung bases are clear.  No focal abnormality identified within
the liver, spleen, pancreas, adrenal glands, or kidneys.  The
gallbladder is present.  There are scattered mesenteric lymph
nodes, predominately within the right lower quadrant.

No evidence for aortic aneurysm.

The uterus is present.  No adnexal mass identified.  There is a
small amount of free pelvic fluid in the right cul-de-sac.

Visualized osseous structures have a normal appearance.
IMPRESSION: 1.  Left mid abdominal short segment small bowel intussusception. A
lead point should be considered.
2.  The appendix appears thickened and inflamed.  Findings are
suspicious for acute appendicitis.

The findings were discussed with Dr. Trust Lindile on 01/09/2012 at [DATE]
p.m..

## 2015-01-22 ENCOUNTER — Encounter (HOSPITAL_COMMUNITY): Payer: Self-pay | Admitting: *Deleted

## 2015-01-22 ENCOUNTER — Emergency Department (HOSPITAL_COMMUNITY)
Admission: EM | Admit: 2015-01-22 | Discharge: 2015-01-22 | Disposition: A | Discharge status: Home / Self Care | Payer: Managed Care, Other (non HMO) | Attending: Emergency Medicine | Admitting: Emergency Medicine

## 2015-01-22 DIAGNOSIS — Z3202 Encounter for pregnancy test, result negative: Secondary | ICD-10-CM | POA: Diagnosis not present

## 2015-01-22 DIAGNOSIS — Z8619 Personal history of other infectious and parasitic diseases: Secondary | ICD-10-CM | POA: Insufficient documentation

## 2015-01-22 DIAGNOSIS — Z91040 Latex allergy status: Secondary | ICD-10-CM | POA: Diagnosis not present

## 2015-01-22 DIAGNOSIS — R252 Cramp and spasm: Secondary | ICD-10-CM | POA: Diagnosis not present

## 2015-01-22 DIAGNOSIS — R42 Dizziness and giddiness: Secondary | ICD-10-CM

## 2015-01-22 DIAGNOSIS — R110 Nausea: Secondary | ICD-10-CM | POA: Diagnosis not present

## 2015-01-22 DIAGNOSIS — Z79899 Other long term (current) drug therapy: Secondary | ICD-10-CM | POA: Diagnosis not present

## 2015-01-22 LAB — CBG MONITORING, ED: Glucose-Capillary: 104 mg/dL — ABNORMAL HIGH (ref 70–99)

## 2015-01-22 LAB — CBC
HEMATOCRIT: 43 % (ref 36.0–46.0)
HEMOGLOBIN: 14.4 g/dL (ref 12.0–15.0)
MCH: 30 pg (ref 26.0–34.0)
MCHC: 33.5 g/dL (ref 30.0–36.0)
MCV: 89.6 fL (ref 78.0–100.0)
Platelets: 231 10*3/uL (ref 150–400)
RBC: 4.8 MIL/uL (ref 3.87–5.11)
RDW: 12.4 % (ref 11.5–15.5)
WBC: 5.5 10*3/uL (ref 4.0–10.5)

## 2015-01-22 LAB — BASIC METABOLIC PANEL
ANION GAP: 9 (ref 5–15)
BUN: 9 mg/dL (ref 6–23)
CALCIUM: 9.4 mg/dL (ref 8.4–10.5)
CHLORIDE: 105 mmol/L (ref 96–112)
CO2: 24 mmol/L (ref 19–32)
CREATININE: 0.68 mg/dL (ref 0.50–1.10)
Glucose, Bld: 105 mg/dL — ABNORMAL HIGH (ref 70–99)
POTASSIUM: 4 mmol/L (ref 3.5–5.1)
SODIUM: 138 mmol/L (ref 135–145)

## 2015-01-22 LAB — POC URINE PREG, ED: Preg Test, Ur: NEGATIVE

## 2015-01-22 MED ORDER — MECLIZINE HCL 25 MG PO TABS: 25.0000 mg | ORAL_TABLET | Freq: Three times a day (TID) | ORAL | Status: AC | PRN

## 2015-01-22 MED ORDER — MECLIZINE HCL 25 MG PO TABS
12.5000 mg | ORAL_TABLET | Freq: Once | ORAL | Status: AC
  Administered 2015-01-22: 12.5 mg via ORAL
  Filled 2015-01-22: qty 1

## 2015-01-22 MED ORDER — SODIUM CHLORIDE 0.9 % IV BOLUS (SEPSIS)
1000.0000 mL | Freq: Once | INTRAVENOUS | Status: AC
  Administered 2015-01-22: 1000 mL via INTRAVENOUS

## 2015-01-22 NOTE — Discharge Instructions (Signed)
Dizziness °Dizziness is a common problem. It is a feeling of unsteadiness or light-headedness. You may feel like you are about to faint. Dizziness can lead to injury if you stumble or fall. A person of any age group can suffer from dizziness, but dizziness is more common in older adults. °CAUSES  °Dizziness can be caused by many different things, including: °· Middle ear problems. °· Standing for too long. °· Infections. °· An allergic reaction. °· Aging. °· An emotional response to something, such as the sight of blood. °· Side effects of medicines. °· Tiredness. °· Problems with circulation or blood pressure. °· Excessive use of alcohol or medicines, or illegal drug use. °· Breathing too fast (hyperventilation). °· An irregular heart rhythm (arrhythmia). °· A low red blood cell count (anemia). °· Pregnancy. °· Vomiting, diarrhea, fever, or other illnesses that cause body fluid loss (dehydration). °· Diseases or conditions such as Parkinson's disease, high blood pressure (hypertension), diabetes, and thyroid problems. °· Exposure to extreme heat. °DIAGNOSIS  °Your health care provider will ask about your symptoms, perform a physical exam, and perform an electrocardiogram (ECG) to record the electrical activity of your heart. Your health care provider may also perform other heart or blood tests to determine the cause of your dizziness. These may include: °· Transthoracic echocardiogram (TTE). During echocardiography, sound waves are used to evaluate how blood flows through your heart. °· Transesophageal echocardiogram (TEE). °· Cardiac monitoring. This allows your health care provider to monitor your heart rate and rhythm in real time. °· Holter monitor. This is a portable device that records your heartbeat and can help diagnose heart arrhythmias. It allows your health care provider to track your heart activity for several days if needed. °· Stress tests by exercise or by giving medicine that makes the heart beat  faster. °TREATMENT  °Treatment of dizziness depends on the cause of your symptoms and can vary greatly. °HOME CARE INSTRUCTIONS  °· Drink enough fluids to keep your urine clear or pale yellow. This is especially important in very hot weather. In older adults, it is also important in cold weather. °· Take your medicine exactly as directed if your dizziness is caused by medicines. When taking blood pressure medicines, it is especially important to get up slowly. °¨ Rise slowly from chairs and steady yourself until you feel okay. °¨ In the morning, first sit up on the side of the bed. When you feel okay, stand slowly while holding onto something until you know your balance is fine. °· Move your legs often if you need to stand in one place for a long time. Tighten and relax your muscles in your legs while standing. °· Have someone stay with you for 1-2 days if dizziness continues to be a problem. Do this until you feel you are well enough to stay alone. Have the person call your health care provider if he or she notices changes in you that are concerning. °· Do not drive or use heavy machinery if you feel dizzy. °· Do not drink alcohol. °SEEK IMMEDIATE MEDICAL CARE IF:  °· Your dizziness or light-headedness gets worse. °· You feel nauseous or vomit. °· You have problems talking, walking, or using your arms, hands, or legs. °· You feel weak. °· You are not thinking clearly or you have trouble forming sentences. It may take a friend or family member to notice this. °· You have chest pain, abdominal pain, shortness of breath, or sweating. °· Your vision changes. °· You notice   any bleeding. °· You have side effects from medicine that seems to be getting worse rather than better. °MAKE SURE YOU:  °· Understand these instructions. °· Will watch your condition. °· Will get help right away if you are not doing well or get worse. °Document Released: 04/20/2001 Document Revised: 10/30/2013 Document Reviewed: 05/14/2011 °ExitCare®  Patient Information ©2015 [employer]. This information is not intended to replace advice given to you by your health care provider. Make sure you discuss any questions you have with your health care provider. ° ° °Emergency Department Resource Guide °1) Find a Doctor and Pay Out of Pocket °Although you won't have to find out who is covered by your insurance plan, it is a good idea to ask around and get recommendations. You will then need to call the office and see if the doctor you have chosen will accept you as a new patient and what types of options they offer for patients who are self-pay. Some doctors offer discounts or will set up payment plans for their patients who do not have insurance, but you will need to ask so you aren't surprised when you get to your appointment. ° °2) Contact Your Local Health Department °Not all health departments have doctors that can see patients for sick visits, but many do, so it is worth a call to see if yours does. If you don't know where your local health department is, you can check in your phone book. The CDC also has a tool to help you locate your state's health department, and many state websites also have listings of all of their local health departments. ° °3) Find a Walk-in Clinic °If your illness is not likely to be very severe or complicated, you may want to try a walk in clinic. These are popping up all over the country in pharmacies, drugstores, and shopping centers. They're usually staffed by nurse practitioners or physician assistants that have been trained to treat common illnesses and complaints. They're usually fairly quick and inexpensive. However, if you have serious medical issues or chronic medical problems, these are probably not your best option. ° °No Primary Care Doctor: °- Call [facility] at  832-8000 - they can help you locate a primary care doctor that  accepts your insurance, provides certain services, etc. °- Physician Referral Service-  1-800-533-3463 ° °Chronic Pain Problems: °Organization         Address  Phone   Notes  °[facility]  (336) 297-2271 Patients need to be referred by their primary care doctor.  ° °Medication Assistance: °Organization         Address  Phone   Notes  °[place] [facility] [address] °[place], [place] [address] (336) 641-8030 --Must be a resident of [place] °-- Must have NO insurance coverage whatsoever (no Medicaid/ Medicare, etc.) °-- The pt. MUST have a primary care doctor that directs their care regularly and follows them in the community °  °[facility]  (866) 331-1348   °[employer]  (888) 892-1162   ° °Agencies that provide inexpensive medical care: °Organization         Address  Phone   Notes  °[facility]  (336) 832-8035   °[facility]    (336) 832-7272   °[facility] [address] °[place], [place] [address] (336) 832-4777   °[facility] [address], °[place] (336) 271-4999   °[facility]    (  336) 373-0678   °[facility]    (336) 272-1050   °[facility] ° [address], [place] Phone:  (336) 832-4444, Fax:  (336) 832-4440 Hours of Operation:  9 am - 6 pm, M-F.  Also accepts Medicaid/Medicare and self-pay.  °[facility] ° [address], [place] Phone: (336) 832-3150, Fax: (336) 832-3151. Hours of Operation:  8:30 am - 5:30 pm, M-F.  Also accepts Medicaid and self-pay.  °[facility] [place] [address], [place] Phone: (336) 878-6027   °[facility] [address], [place], [place] (336)723-1848, Ext. 123 Mondays & Thursdays: 7-9 AM.  First 15 patients are seen on a first come, first serve basis. °  ° °Medicaid-accepting [place] Providers: ° °Organization         Address  Phone   Notes  °[facility] [address],  [place] (336) 641-2100 Also accepts self-pay patients.  °[facility] [address], [place] ° (336) 856-9996   °[facility] [address], [place] (336) 288-8857   °[facility] [address] [place] [address], [place] (336) 299-7000   °Veita Bland [address], [place]  ° (336) 373-1557 Only accepts [place] Access Medicaid patients after they have their name applied to their card.  ° °Self-Pay (no insurance) in [place]: ° °Organization         Address  Phone   Notes  °Sickle Cell Patients, [facility] [address], [place] (336) 832-1970   °[facility] [address], [place] (336) 832-4400   °[facility] ° [address], [place] (336) 992-4800   °[facility]/Dr. Osei-Bonsu ° [address] [place] [address], [place] or [address], [place] (336) 841-8500 Phone number for both [place] and [place] locations is the same.  °[facility] [address], [place] (336) 299-0000   °[facility] [address] [place] [address], [place] or [address] (336) 852-7530 °(336) 878-2260   °[facility] [address], [place] (336) 350-1642, phone; (336) 294-5005, fax Sees patients 1st and 3rd Saturday of every month.  Must not qualify for public or private insurance (i.e. Medicaid, Medicare, [place] Health Choice, Veterans' Benefits) • Household income should be no more than 200% of the poverty level •The clinic cannot treat you if you are pregnant or think you are pregnant • Sexually transmitted diseases are not treated at the clinic.  ° ° °Dental Care: °Organization         Address  Phone  Notes  °[facility] [facility] [address],  (336) 641-6152 Accepts children up to age 21 who are enrolled in  Medicaid or [place] Health Choice; pregnant women with a Medicaid card; and children who have applied for Medicaid or [place] Health Choice, but were declined, whose parents can pay a reduced fee at time of service.  °[facility] [place]  [address], [place] (336) 641-7733 Accepts children up to age 21 who are enrolled in Medicaid or [place] Health Choice; pregnant women with a Medicaid card; and children who have applied for Medicaid or [place] Health Choice, but were declined, whose parents can pay a   reduced fee at time of service.  °[facility] ° [address], [place] (336) 641-4533 Patients are seen by appointment only. Walk-ins are not accepted. [facility] will see patients 18 years of age and older. °Monday - Tuesday (8am-5pm) °Most Wednesdays (8:30-5pm) °$30 per visit, cash only  °[facility] ° [address], [place] (336) 641-4533 Patients are seen by appointment only. Walk-ins are not accepted. [facility] will see patients 18 years of age and older. °One Wednesday Evening (Monthly: Volunteer Based).  $30 per visit, cash only  °[facility]  (919) 537-3737 for adults; Children under age 4, call Graduate Pediatric Dentistry at (919) [phone]. Children aged 4-14, please call (919) [phone] to request a pediatric application. ° Dental services are provided in all areas of dental care including fillings, crowns and bridges, complete and partial dentures, implants, gum treatment, root canals, and extractions. Preventive care is also provided. Treatment is provided to both adults and children. °Patients are selected via a lottery and there is often a waiting list. °  °[facility] [address], °[place] ° (336) 763-8833 [email or web address] °  °[facility] [address], [place], [place] (336)723-1848, Ext. 123 Second and Fourth Thursday of each month, opens at 6:30  AM; Clinic ends at 9 AM.  Patients are seen on a first-come first-served basis, and a limited number are seen during each clinic.  ° °[facility] ° [address], [place], [place] (336) 723-7904   Eligibility Requirements °You must have lived in [place], [place], or [place] counties for at least the last three months. °  You cannot be eligible for state or federal sponsored healthcare insurance, including Veterans Administration, Medicaid, or Medicare. °  You generally cannot be eligible for healthcare insurance through your employer.  °  How to apply: °Eligibility screenings are held every Tuesday and Wednesday afternoon from 1:00 pm until 4:00 pm. You do not need an appointment for the interview!  °[facility] [address], [place], [place] 336-631-2330   °[facility]  336-342-8273   °[facility]  336-703-3100   °[facility]  336-570-6415   ° °Behavioral Health Resources in the Community: °Intensive Outpatient Programs °Organization         Address  Phone  Notes  °[facility] [address], [place], [place] 336-878-6098   °[facility] [address], [place], [place] 336-832-9800   °[facility] [address], [place], [place] ° 336-882-2125   °[facility] [address],  °[place],  1-800-853-5163 or 336-641-4981   °Substance Abuse Resources °Organization         Address  Phone  Notes  °[facility]  336-882-2125   °[facility]  336-784-9470   °[facility]  336-285-9073   °[facility]  336-845-3988   °Residential & Outpatient Substance Abuse Program  1-800-659-3381   °Psychological Services °Organization         Address  Phone  Notes  °[facility]  336- 832-9600   °[facility]  336- 378-7881   °[facility] [address], [place] 1-800-853-5163 or  336-641-4981   ° °Mobile Crisis Teams °Organization         Address  Phone  Notes  °[facility], Mobile Crisis Care Unit  1-877-626-1772   °  Assertive °[facility] ° [address] [place], [place] 336-834-9664   °Sharon DeEsch [address] °[place] 336-554-5454   ° °Self-Help/Support Groups °Organization         Address  Phone             Notes  °[facility] - variety of support groups  336- 373-1402 Call for more information  °Narcotics Anonymous (NA), [facility] [address], °[place]  2 meetings at this location  ° °Residential Treatment Programs °Organization         Address  Phone  Notes  °[facility] [address],    °[place]  1-866-801-8205   °[facility] ° [address], [place], [place] 704-293-8524   °[facility] [address], [place] 336-845-3988 Admissions: 8am-3pm M-F  °[facility] [address]    °[place], [place] 336-841-1104   °[facility] [address], [place], [place] 336-379-7146   °[facility] [address]  °[place], [place] 336-285-9073   °[facility] - Intensive Outpatient [address], [place], [place] 336-852-3033   °[facility] [address]  °[place], [place] 1-877-615-2722 or 336-784-9470   °[facility] [address] [place], [place] 336-227-7417 Accepts Medicaid  °[facility] [address]  °[place] 1-800-659-3381 Substance Abuse/Addiction Treatment  ° °[place] Behavioral Health Resources °Organization         Address  Phone  Notes  °[facility]  (888) 581-9988   °Julie Brannon, PhD [address] [place], [place]   (336) 349-5553 or (336) 951-0000   ° Behavioral   [address] °[place], [place] (336) 349-4454   °[facility] [address],  [place], [place] (336) 342-8316 Insurance/Medicaid/sponsorship through [facility]  °[facility] [address]                                    [place], [place] (336) 342-8316 Therapy/tele-psych/case  °[facility] [address]  ° [place], [place] (336) 349-2233    °Dr. Arfeen  (336) 349-4544   °[facility] [place]  [employer] [place] [facility] 1) [address], [place] °2) [address], [place] °3)  [address], [place] (336) 349-3220 °(336) 342-7768 ° °(336) 342-8140   °[place] [facility] (336) 342-1394 or (336) 342-3537 (After Hours)    ° °  °

## 2015-01-22 NOTE — ED Notes (Signed)
MD Goldston at bedside  

## 2015-01-22 NOTE — ED Notes (Addendum)
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 Reports increased/worse symptoms x1 week. Dizziness/lightheadedness, piercing headaches and nausea. Headache 4/10.  Reports when she stood up earlier this morning her "right eye went black for 5-6 seconds". Reports the dizziness is that "everything else is moving around her".   Pt also reports intermittent right hand cramps lasting 15-30 minutes. Pt reports visual acuity test was more difficult than normal.

## 2015-01-22 NOTE — ED Provider Notes (Signed)
CSN: [id number]     Arrival date & time 01/22/15  0654 History   First MD Initiated Contact with Patient 01/22/15 [phone]     Chief Complaint  Patient presents with  . lymes disease, dizziness      (Consider location/radiation/quality/duration/timing/severity/associated sxs/prior Treatment) HPI Barbara Pacheco is a 24 y.o female with a history of lyme disease diagnosed 5 years ago who presents with increasing symptoms of fatigue and dizziness for the past week.  She usually gets this along with nausea and a headache for 3-4 days but it resolves.  Standing makes it worse. Nothing makes it better. She has had intermittent hand cramping that last up to 30 minutes.  She has not taken anything for pain prior to arrival. She denies any vomiting or abdominal pain.  Past Medical History  Diagnosis Date  . Lyme disease    Past Surgical History  Procedure Laterality Date  . Laparoscopic appendectomy  01/09/2012    Procedure: APPENDECTOMY LAPAROSCOPIC;  Surgeon: Adolph Pollack, MD;  Location: [facility];  Service: General;  Laterality: N/A;  . Examination under anesthesia  01/09/2012    Procedure: EXAM UNDER ANESTHESIA;  Surgeon: Adolph Pollack, MD;  Location: [facility];  Service: General;  Laterality: N/A;   History reviewed. No pertinent family history. History  Substance Use Topics  . Smoking status: Never Smoker   . Smokeless tobacco: Never Used  . Alcohol Use: Yes     Comment: socially   OB History    No data available     Review of Systems  Constitutional: Negative for fever.  Cardiovascular: Negative for chest pain.  Gastrointestinal: Positive for nausea.  Musculoskeletal: Positive for arthralgias.  Neurological: Positive for dizziness.  All other systems reviewed and are negative.     Allergies  Latex  Home Medications   Prior to Admission medications   Medication Sig Start Date End Date Taking? Authorizing Provider  amphetamine-dextroamphetamine (ADDERALL) 15 MG tablet Take 15  mg by mouth 2 (two) times daily.   Yes Historical Provider, MD  Beta Glucan POWD Take 2 tablets by mouth 2 (two) times daily.   Yes Historical Provider, MD  Cholecalciferol (VITAMIN D PO) Take 1 tablet by mouth daily.   Yes Historical Provider, MD  dronabinol (MARINOL) 2.5 MG capsule Take 2.5 mg by mouth 3 (three) times daily as needed (nausea.).   Yes Historical Provider, MD  HYDROcodone-acetaminophen (NORCO/VICODIN) 5-325 MG per tablet Take 1 tablet by mouth every 6 (six) hours as needed for moderate pain.   Yes Historical Provider, MD  ibuprofen (ADVIL,MOTRIN) 200 MG tablet Take 400 mg by mouth every 6 (six) hours as needed for headache or moderate pain.   Yes Historical Provider, MD  OVER THE COUNTER MEDICATION Take 5 tablets by mouth 2 (two) times daily. ATP Fuel   Yes Historical Provider, MD  OVER THE COUNTER MEDICATION Take 10 drops by mouth 3 (three) times daily as needed (lyme disease). [place]ur--in four ounces of water.   Yes Historical Provider, MD  OVER THE COUNTER MEDICATION Take 10 drops by mouth 3 (three) times daily as needed (lyme disease). Pinella   Yes Historical Provider, MD  OVER THE COUNTER MEDICATION Take 40 drops by mouth every other day. In four ounces of water--[place]ro   Yes Historical Provider, MD  OVER THE COUNTER MEDICATION Take 3 drops by mouth daily. In four ounces of water- Samento   Yes Historical Provider, MD  OVER THE COUNTER MEDICATION Take 3 drops by mouth daily.  In four ounces of water-- Banderol   Yes Historical Provider, MD  Probiotic Product (PROBIOTIC PO) Take 1 tablet by mouth 2 (two) times daily. Prescript assist pro   Yes Historical Provider, MD  traMADol (ULTRAM) 50 MG tablet Take 50 mg by mouth every 6 (six) hours as needed.   Yes Historical Provider, MD  valACYclovir (VALTREX) 500 MG tablet Take 500 mg by mouth daily as needed (outbreak).   Yes Historical Provider, MD  VITAMIN E PO Take 1 tablet by mouth daily.   Yes Historical Provider, MD  meclizine  (ANTIVERT) 25 MG tablet Take 1 tablet (25 mg total) by mouth 3 (three) times daily as needed for dizziness. 01/22/15   Demonie Kassa Patel-Mills, PA-C   BP 111/72 mmHg  Pulse 85  Temp(Src) 98.6 F (37 C) (Oral)  Resp 20  SpO2 99% Physical Exam  Constitutional: She is oriented to person, place, and time. She appears well-developed and well-nourished.  HENT:  Head: Normocephalic and atraumatic.  Right Ear: External ear normal.  Left Ear: External ear normal.  Eyes: Conjunctivae and EOM are normal. Pupils are equal, round, and reactive to light.  Neck: Normal range of motion. Neck supple.  Cardiovascular: Normal rate, regular rhythm and normal heart sounds.   Pulmonary/Chest: Effort normal and breath sounds normal.  Abdominal: Soft. There is no tenderness.  Musculoskeletal: Normal range of motion.  Neurological: She is alert and oriented to person, place, and time. She has normal strength. No cranial nerve deficit.  Skin: Skin is warm and dry.  Nursing note and vitals reviewed.   ED Course  Procedures (including critical care time) Labs Review Labs Reviewed  BASIC METABOLIC PANEL - Abnormal; Notable for the following:    Glucose, Bld 105 (*)    All other components within normal limits  CBG MONITORING, ED - Abnormal; Notable for the following:    Glucose-Capillary 104 (*)    All other components within normal limits  CBC  POC URINE PREG, ED    Imaging Review No results found.   EKG Interpretation None      MDM   Final diagnoses:  Dizziness   Patient states she is here for, "an exacerbation of her lyme disease." She was diagnosed 5 years and and had been following up with [place] disease specialist in [place] until several months ago. She is currently treated with Samento and Banderol which is mailed to her from [place].  She says she usually has nausea, fatigue, arthralgias, dizziness, and temporary loss of vision during her "flare ups" but that the dizziness lasted longer than  the normal, 3-4 days, and has been going on for a week now.  She describes it as the room is spinning.  Her vitals are stable and her labs are unremarkable. I have given her fluids, meclazine, and zofran.  She would like a referral to the infectious disease physician Dr. Lilli LightHatchet.  I also gave her a neurology referral.  She agrees with the plan to f/u with with a primary care physician using the resource guide. Dr. Criss AlvineGoldston also spoke with the family at length about they questions and concerns.   Barbara GosselinHanna Patel-Mills, PA-C 01/23/15 [id number]  Barbara LovelessScott Goldston, MD 01/23/15 [phone]

## 2015-02-11 IMAGING — US US ABDOMEN COMPLETE
1 series · 14 of 25 positions shown · non-contrast
Comparison: CT abdomen pelvis of 01/09/2012

CLINICAL DATA: Right upper quadrant pain, elevated alkaline
phosphatase, some nausea and vomiting

COMPLETE ABDOMINAL ULTRASOUND

[Series 1: us abdomen complete · 0.22mm/px · 14 of 85 slices shown]
[im 1/85]
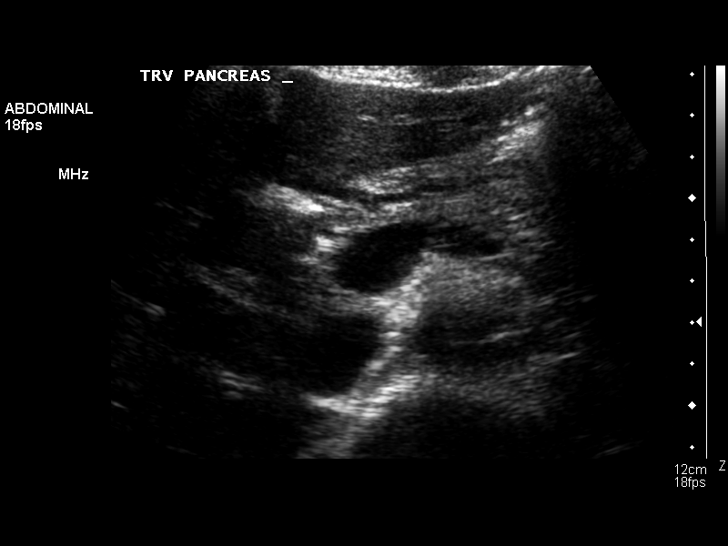
[im 8/85]
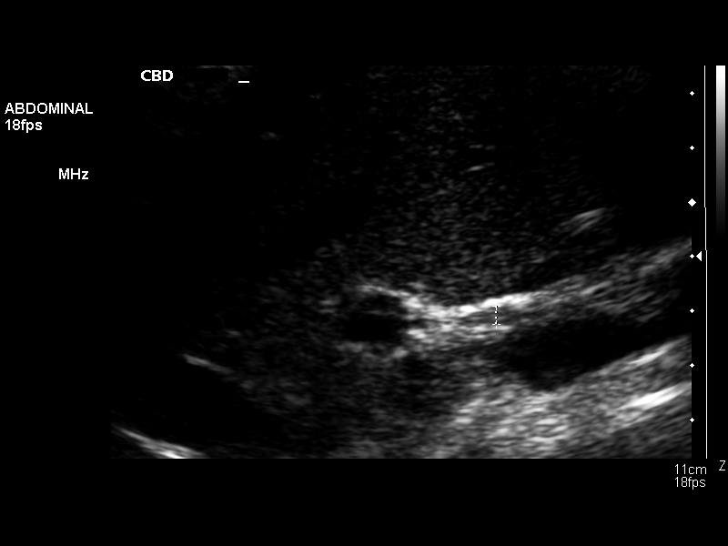
[im 15/85]
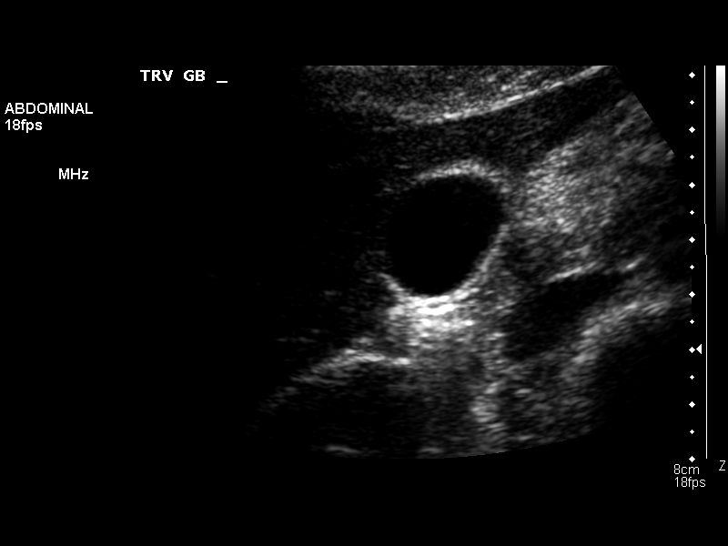
[im 22/85]
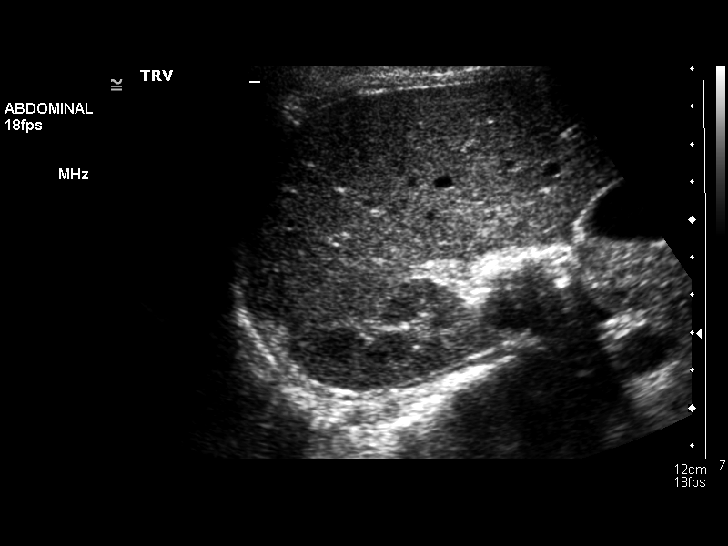
[im 29/85]
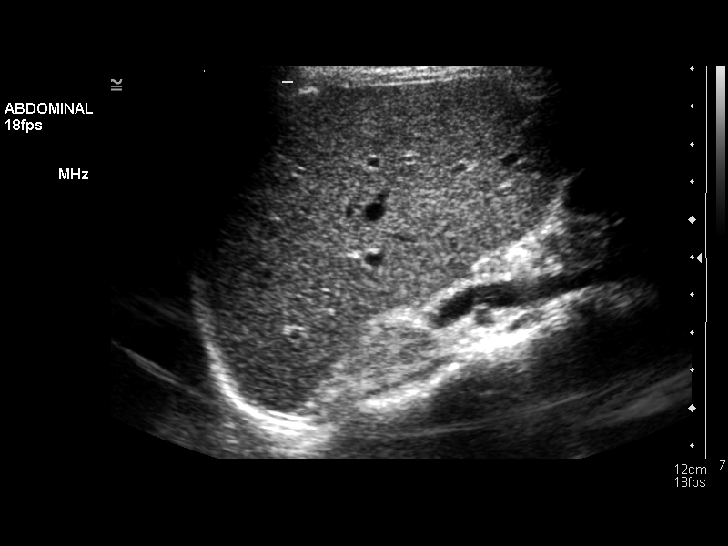
[im 32/85]
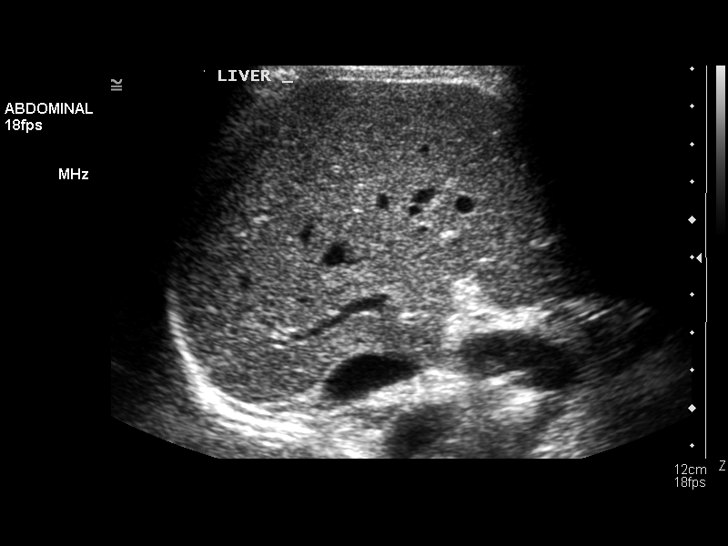
[im 39/85]
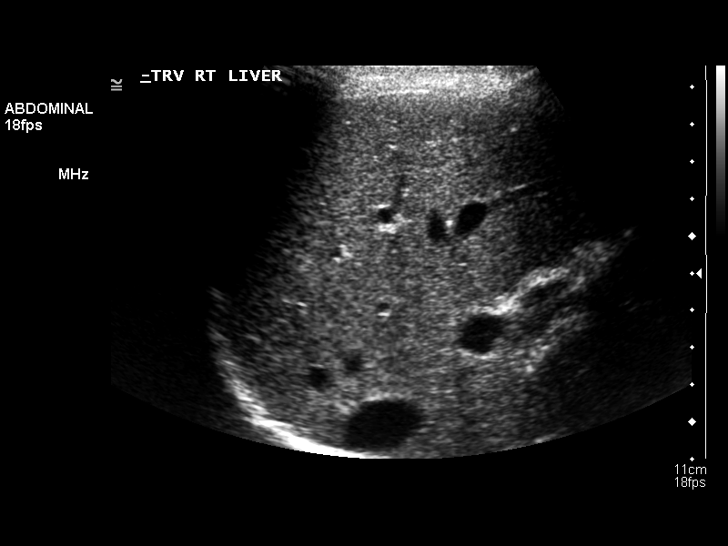
[im 46/85]
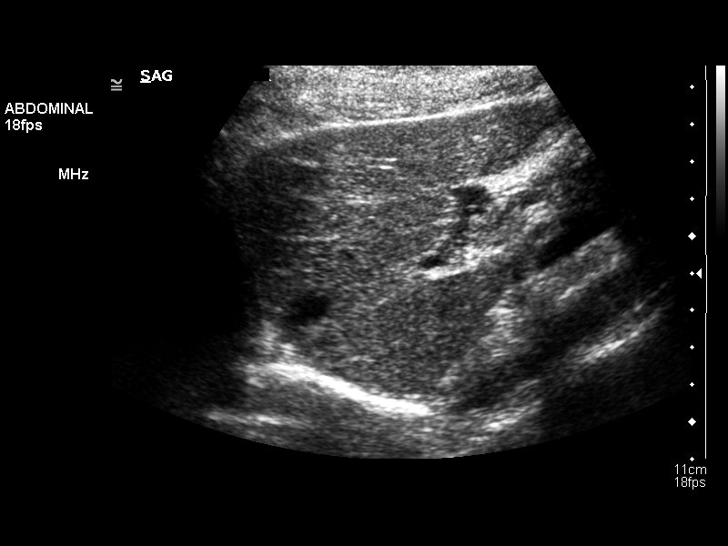
[im 53/85]
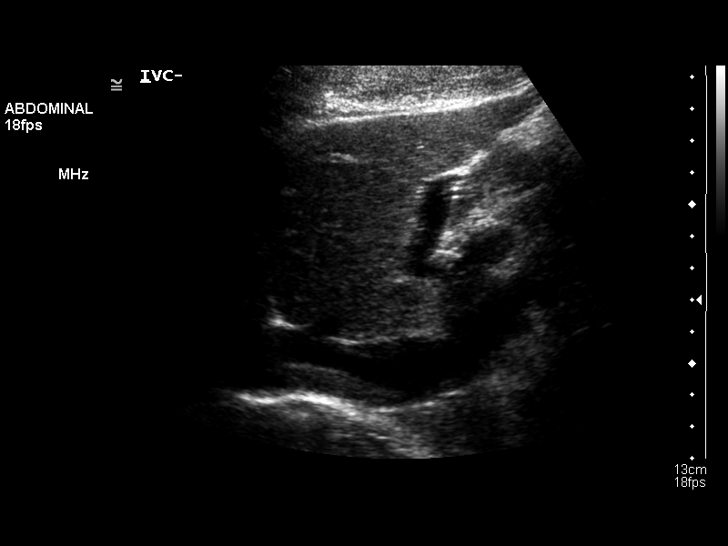
[im 57/85]
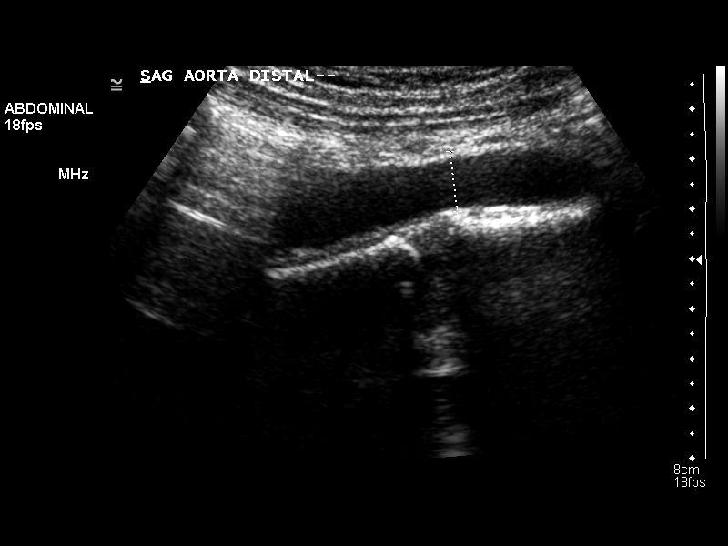
[im 64/85]
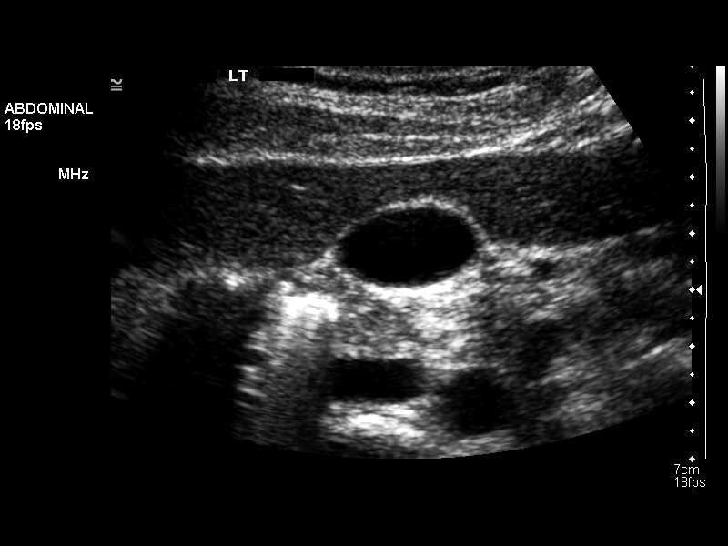
[im 71/85]
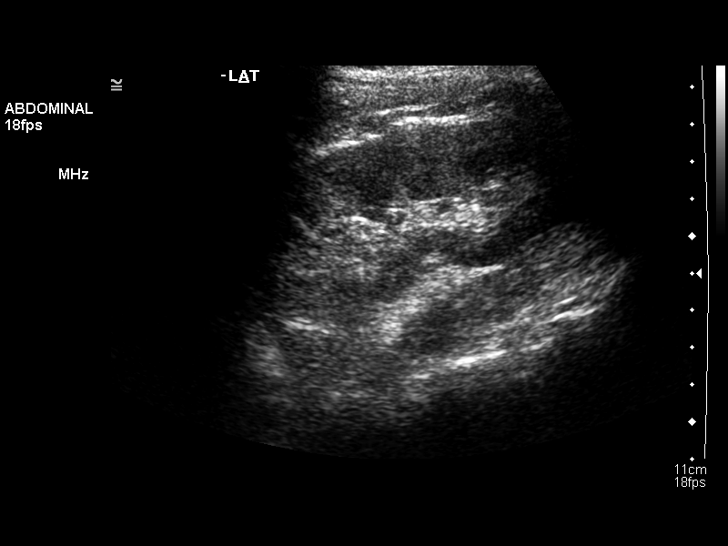
[im 78/85]
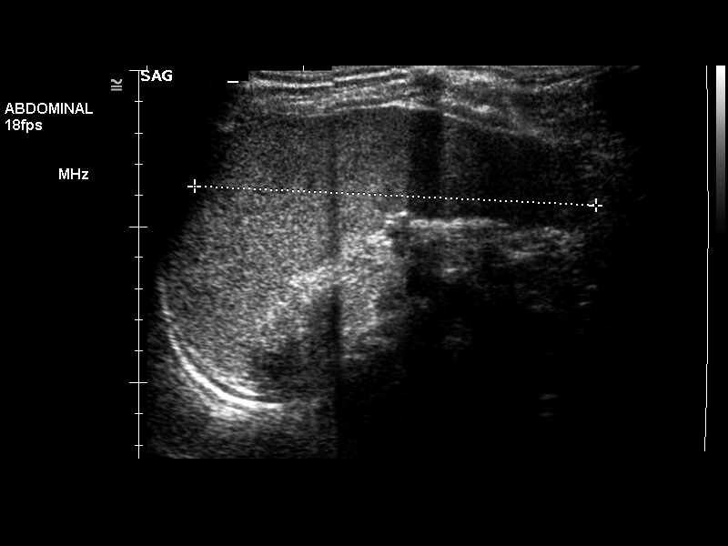
[im 85/85]
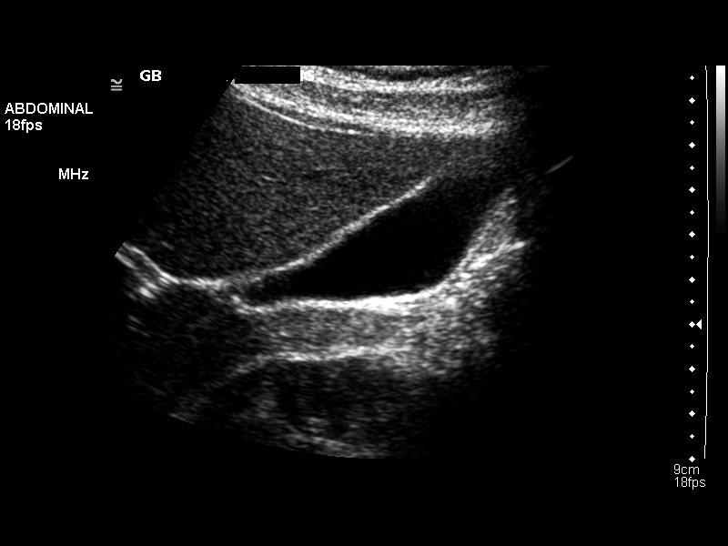

[14 of 25 positions shown; findings below may reference images not displayed]

FINDINGS: Gallbladder:  The gallbladder is visualized and no gallstones are
noted.  There is no pain over the gallbladder with compression.

Common bile duct:  The common bile duct is normal measuring 2.6 mm
in diameter distally.

Liver:  The liver has a normal echogenic pattern.  No focal
abnormality is seen.

IVC:  Appears normal.

Pancreas:  No focal abnormality seen.

Spleen:  The spleen is prominent measuring 12.9 cm sagittally with
a total volume of 425 ml.

Right Kidney:  No hydronephrosis is seen.  The right kidney
measures 10.4 cm sagittally.

Left Kidney:  No hydronephrosis is noted.  The left kidney measures
10.7 cm.

Abdominal aorta:  Abdominal aorta is normal in caliber.
IMPRESSION: 1.  No gallstones.  No ductal dilatation.
2.  Mild splenomegaly.

## 2019-08-09 ENCOUNTER — Other Ambulatory Visit: Payer: Self-pay

## 2019-08-09 DIAGNOSIS — Z20822 Contact with and (suspected) exposure to covid-19: Secondary | ICD-10-CM

## 2019-08-10 LAB — NOVEL CORONAVIRUS, NAA: SARS-CoV-2, NAA: NOT DETECTED

## 2019-10-26 ENCOUNTER — Other Ambulatory Visit: Payer: Self-pay

## 2019-10-26 DIAGNOSIS — Z20822 Contact with and (suspected) exposure to covid-19: Secondary | ICD-10-CM

## 2019-10-27 LAB — NOVEL CORONAVIRUS, NAA: SARS-CoV-2, NAA: NOT DETECTED
